# Patient Record
Sex: Female | Born: 1945 | Race: White | Hispanic: No | Smoking: Former smoker
Health system: Southern US, Community
[De-identification: ages and names within clinical notes are randomized; demographics above are authoritative.]

## PROBLEM LIST (undated history)

## (undated) DIAGNOSIS — N390 Urinary tract infection, site not specified: Secondary | ICD-10-CM

## (undated) DIAGNOSIS — K219 Gastro-esophageal reflux disease without esophagitis: Secondary | ICD-10-CM

## (undated) DIAGNOSIS — M1990 Unspecified osteoarthritis, unspecified site: Secondary | ICD-10-CM

## (undated) DIAGNOSIS — F3289 Other specified depressive episodes: Secondary | ICD-10-CM

## (undated) DIAGNOSIS — F329 Major depressive disorder, single episode, unspecified: Secondary | ICD-10-CM

## (undated) DIAGNOSIS — I341 Nonrheumatic mitral (valve) prolapse: Secondary | ICD-10-CM

## (undated) DIAGNOSIS — Z803 Family history of malignant neoplasm of breast: Secondary | ICD-10-CM

## (undated) DIAGNOSIS — D72819 Decreased white blood cell count, unspecified: Secondary | ICD-10-CM

## (undated) DIAGNOSIS — J309 Allergic rhinitis, unspecified: Secondary | ICD-10-CM

## (undated) DIAGNOSIS — G4700 Insomnia, unspecified: Secondary | ICD-10-CM

## (undated) DIAGNOSIS — F419 Anxiety disorder, unspecified: Secondary | ICD-10-CM

## (undated) DIAGNOSIS — R5383 Other fatigue: Secondary | ICD-10-CM

## (undated) DIAGNOSIS — Z780 Asymptomatic menopausal state: Secondary | ICD-10-CM

## (undated) DIAGNOSIS — Z800 Family history of malignant neoplasm of digestive organs: Secondary | ICD-10-CM

## (undated) HISTORY — DX: Urinary tract infection, site not specified: N39.0

## (undated) HISTORY — DX: Gastro-esophageal reflux disease without esophagitis: K21.9

## (undated) HISTORY — DX: Other fatigue: R53.83

## (undated) HISTORY — DX: Family history of malignant neoplasm of breast: Z80.3

## (undated) HISTORY — DX: Insomnia, unspecified: G47.00

## (undated) HISTORY — DX: Allergic rhinitis, unspecified: J30.9

## (undated) HISTORY — DX: Decreased white blood cell count, unspecified: D72.819

## (undated) HISTORY — DX: Anxiety disorder, unspecified: F41.9

## (undated) HISTORY — DX: Asymptomatic menopausal state: Z78.0

## (undated) HISTORY — DX: Other specified depressive episodes: F32.89

## (undated) HISTORY — DX: Family history of malignant neoplasm of digestive organs: Z80.0

## (undated) HISTORY — DX: Nonrheumatic mitral (valve) prolapse: I34.1

## (undated) HISTORY — DX: Major depressive disorder, single episode, unspecified: F32.9

## (undated) HISTORY — DX: Unspecified osteoarthritis, unspecified site: M19.90

---

## 1976-03-04 HISTORY — PX: LAPAROSCOPY: SHX197

## 1999-08-15 ENCOUNTER — Ambulatory Visit (HOSPITAL_COMMUNITY): Admission: RE | Admit: 1999-08-15 | Discharge: 1999-08-15 | Payer: Self-pay | Admitting: Gastroenterology

## 2000-04-04 DIAGNOSIS — M1990 Unspecified osteoarthritis, unspecified site: Secondary | ICD-10-CM

## 2000-04-04 HISTORY — DX: Unspecified osteoarthritis, unspecified site: M19.90

## 2000-05-13 ENCOUNTER — Other Ambulatory Visit: Admission: RE | Admit: 2000-05-13 | Discharge: 2000-05-13 | Payer: Self-pay | Admitting: Obstetrics and Gynecology

## 2000-05-14 ENCOUNTER — Encounter (INDEPENDENT_AMBULATORY_CARE_PROVIDER_SITE_OTHER): Payer: Self-pay

## 2000-05-14 ENCOUNTER — Other Ambulatory Visit: Admission: RE | Admit: 2000-05-14 | Discharge: 2000-05-14 | Payer: Self-pay | Admitting: Obstetrics and Gynecology

## 2001-09-30 ENCOUNTER — Other Ambulatory Visit: Admission: RE | Admit: 2001-09-30 | Discharge: 2001-09-30 | Payer: Self-pay | Admitting: Obstetrics and Gynecology

## 2002-12-07 ENCOUNTER — Encounter: Payer: Self-pay | Admitting: Allergy and Immunology

## 2002-12-07 ENCOUNTER — Encounter: Admission: RE | Admit: 2002-12-07 | Discharge: 2002-12-07 | Payer: Self-pay | Admitting: Allergy and Immunology

## 2003-04-29 ENCOUNTER — Other Ambulatory Visit: Admission: RE | Admit: 2003-04-29 | Discharge: 2003-04-29 | Payer: Self-pay | Admitting: Obstetrics and Gynecology

## 2004-04-05 ENCOUNTER — Encounter: Admission: RE | Admit: 2004-04-05 | Discharge: 2004-04-05 | Payer: Self-pay | Admitting: Internal Medicine

## 2004-07-02 DIAGNOSIS — D72819 Decreased white blood cell count, unspecified: Secondary | ICD-10-CM

## 2004-07-02 HISTORY — DX: Decreased white blood cell count, unspecified: D72.819

## 2004-11-30 ENCOUNTER — Other Ambulatory Visit: Admission: RE | Admit: 2004-11-30 | Discharge: 2004-11-30 | Payer: Self-pay | Admitting: Obstetrics and Gynecology

## 2005-01-15 ENCOUNTER — Ambulatory Visit (HOSPITAL_COMMUNITY): Admission: RE | Admit: 2005-01-15 | Discharge: 2005-01-15 | Payer: Self-pay | Admitting: Plastic Surgery

## 2005-09-24 LAB — HM COLONOSCOPY: HM Colonoscopy: NORMAL

## 2007-06-26 ENCOUNTER — Other Ambulatory Visit: Admission: RE | Admit: 2007-06-26 | Discharge: 2007-06-26 | Payer: Self-pay | Admitting: Obstetrics and Gynecology

## 2007-09-02 LAB — HM DEXA SCAN

## 2010-07-20 NOTE — Procedures (Signed)
[place]  Patient:    Stephanie Harrell, Stephanie Harrell                      MRN: [id number] Proc. Date: 08/15/99 Adm. Date:  [id number] Disc. Date: [id number] Attending:  Charna Elizabeth CC:         Heather Roberts, M.D.                           Procedure Report  DATE OF BIRTH:  1945-03-06  REFERRING PHYSICIAN:  Heather Roberts, M.D.  PROCEDURE PERFORMED:  Colonoscopy.  ENDOSCOPIST:  Anselmo Rod, M.D.  INSTRUMENT USED:  Olympus video colonoscope.  INDICATION FOR PROCEDURE:  Family history of colon cancer, blood in stool in a 65 year old white female.  Rule out colonic polyps, masses, hemorrhoids, etc.  PREPROCEDURE PREPARATION:  Informed consent was procured from the patient. The patient was fasted for eight hours prior to the procedure and prepped with a bottle of magnesium citrate and a gallon of NuLytely the night prior to the procedure.  PREPROCEDURE PHYSICAL:  The patient has stable vital signs.  NECK:  Supple.  CHEST: Clear to auscultation.  S1 and S2 regular.  No murmurs, rub, or gallop. No rales, rhonchi or wheezing.  ABDOMEN: Soft with normal abdominal bowel sounds.  No hepatosplenomegaly.  No masses palpable.  DESCRIPTION OF PROCEDURE:  The patient was placed in the left lateral decubitus position and sedated with 75 mg of Demerol and 7 mg of Versed intravenously.  Once the patient was adequately sedated and maintained on low flow oxygen, continuous cardiac monitoring, the Olympus video colonoscope was advanced from the rectum to the cecum without difficulty.  Except for small internal hemorrhoids no other abnormalities were seen.  The procedure was completed to the cecum. The appendicular orifice and the ileocecal valve were clearly visualized.  No masses, polyps, erosions, ulcerations or diverticulosis were seen.  IMPRESSION:  Normal colonoscopy except for small nonbleeding internal hemorrhoids.  RECOMMENDATIONS: 1. The  patient had been advised to increase her fluids and fiber in her diet. 2. Considering her family history of colon cancer repeat colonoscopy is    recommended in the next five years. 3. Outpatient follow up is advised in the next two weeks. DD:  08/15/99 TD:  08/20/99 Job: [id number]

## 2011-01-03 ENCOUNTER — Ambulatory Visit (INDEPENDENT_AMBULATORY_CARE_PROVIDER_SITE_OTHER): Payer: BC Managed Care – PPO | Admitting: Family Medicine

## 2011-01-03 ENCOUNTER — Encounter: Payer: Self-pay | Admitting: Family Medicine

## 2011-01-03 VITALS — BP 120/70 | HR 68 | Temp 98.6°F | Ht 68.0 in | Wt 140.0 lb

## 2011-01-03 DIAGNOSIS — K219 Gastro-esophageal reflux disease without esophagitis: Secondary | ICD-10-CM

## 2011-01-03 DIAGNOSIS — J329 Chronic sinusitis, unspecified: Secondary | ICD-10-CM

## 2011-01-03 DIAGNOSIS — G4700 Insomnia, unspecified: Secondary | ICD-10-CM | POA: Insufficient documentation

## 2011-01-03 DIAGNOSIS — J309 Allergic rhinitis, unspecified: Secondary | ICD-10-CM

## 2011-01-03 DIAGNOSIS — F3289 Other specified depressive episodes: Secondary | ICD-10-CM | POA: Insufficient documentation

## 2011-01-03 DIAGNOSIS — F329 Major depressive disorder, single episode, unspecified: Secondary | ICD-10-CM

## 2011-01-03 MED ORDER — AZITHROMYCIN 250 MG PO TABS: ORAL_TABLET | ORAL | Status: AC

## 2011-01-03 MED ORDER — TRAZODONE HCL 50 MG PO TABS: 25.0000 mg | ORAL_TABLET | Freq: Every day | ORAL | Status: DC

## 2011-01-03 MED ORDER — SERTRALINE HCL 100 MG PO TABS: 150.0000 mg | ORAL_TABLET | Freq: Every day | ORAL | Status: DC

## 2011-01-03 NOTE — Patient Instructions (Signed)
I recommend you take a once-daily antihistamine, such as Claritin, Allegra or Zyrtec.  You may also use a decongestant such as sudafed--take it if you are having sinus pain.  You may also want to try sinus rinses or Neti-Pot to help with the sinus pain  If your symptoms of sinus pain aren't improving over the next few days of using these medications, or if secretions become more discolored, then go ahead and start the antibiotics.  Make sure that if you start the antibiotics, that you complete the entire course.

## 2011-01-03 NOTE — Progress Notes (Signed)
Chief complaint: sinus problems. runny nose x 3 weeks. Tried Sudafed and Benadryl w/o any relief. Facial tenderness, some coughing  HPI: She was sick a few weeks ago with flu-like symptoms.  Lowgrade fever, body aches, head congestion and cough, and some diarrhea as well.  Most symptoms have resolved, just the sinus symptoms have remained, and worsened.  Nasal mucus is clear, constant runny nose, +postnasal drainage.  Phlegm is yellow-white.  +sneezing, no itchy eyes.  Patient is also requesting refills on Trazadone and Sertraline.  1/2 tablet of Trazadone seems to be effective in treating insomnia.  Sertraline is effective in treating her depression.  Job isn't ideal, looking for other work, has some stress, but overall is doing okay.  Past Medical History  Diagnosis Date  . Depressive disorder, not elsewhere classified   . Insomnia   . Mitral valve prolapse   . GERD (gastroesophageal reflux disease)   . Allergic rhinitis, cause unspecified     mold, mildew, and seasonal allergies  . Endometriosis     Past Surgical History  Procedure Date  . Laparoscopy     (prior to getting pregnant, some sort of treatment to ovary??)    History   Social History  . Marital Status: Married    Spouse Name: N/A    Number of Children: 2  . Years of Education: N/A   Occupational History  . 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 Smoking status: Current Some Day Smoker  . Smokeless tobacco: Never Used   Comment: smokes a cigarette every 10 days or so  . Alcohol Use: Not on file     2 glasses of wine most days of the week  . Drug Use: No  . Sexually Active: Not on file   Other Topics Concern  . Not on file   Social History Narrative  . No narrative on file    Family History  Problem Relation Age of Onset  . Hypertension Mother   . Cancer Mother     uterine cancer @ 64; Breast cancer at 43 and 65's  . Heart disease Father   . Cancer Father     pancreatic  .  Depression Father   . Depression Sister   . Depression Brother   . Cancer Paternal Aunt     breast  . Cancer Maternal Grandmother 83    colon cancer   Current outpatient prescriptions:lansoprazole (PREVACID) 15 MG capsule, Take 30 mg by mouth daily.  , Disp: , Rfl: ;  sertraline (ZOLOFT) 100 MG tablet, Take 1.5 tablets (150 mg total) by mouth daily., Disp: 45 tablet, Rfl: 5;  traZODone (DESYREL) 50 MG tablet, Take 0.5 tablets (25 mg total) by mouth at bedtime. You may take a full tablet at bedtime if needed, Disp: 30 tablet, Rfl: 5 DISCONTD: sertraline (ZOLOFT) 100 MG tablet, Take 150 mg by mouth daily.  , Disp: , Rfl: ;  azithromycin (ZITHROMAX Z-PAK) 250 MG tablet, Take 2 tablets (500 mg) on  Day 1,  followed by 1 tablet (250 mg) once daily on Days 2 through 5., Disp: 6 each, Rfl: 0  Allergies  Allergen Reactions  . Codeine Other (See Comments)    Feels faint.   ROS:  Denies recent fevers, chest pain, rare palpitations (related to MVP).  Denies shortness of breath, skin rash.  Diarrhea has resolved.  No nausea, vomiting, or any reflux as long as she takes her medication.  Moods stable, insomnia controlled.  No joint pains or other concerns  PHYSICAL EXAM: BP 120/70  Pulse 68  Temp(Src) 98.6 F (37 C) (Oral)  Ht 5\' 8"  (1.727 m)  Wt 140 lb (63.504 kg)  BMI 21.29 kg/m2 Well developed, pleasant female in no distress. Rare throat-clearing HEENT: PERRL. EOMI, conjunctiva clear.  TM's and EAC's normal.  OP clear, mild cobblestoning posteriorly.  Nasal mucosa mildly edematous, no erythema or purulence.  Mildly tender over bilateral maxillary sinuses. Neck: no lymphadenopathy, thyromegaly or mass Heart: regular rate and rhythm without murmur, rubs, gallops Lungs: clear bilaterally Abdomen: soft, nontender, no organomegaly or mass Extremities: no edema, 2+ pulses Skin: no rash Neuro: alert and oriented x 3; normal strength, sensation, gait Psych: normal mood, affect, hygiene and  grooming  ASSESSMENT/PLAN: 1. Allergic rhinitis, cause unspecified    2. Depressive disorder, not elsewhere classified  sertraline (ZOLOFT) 100 MG tablet  3. Insomnia  traZODone (DESYREL) 50 MG tablet  4. Sinusitis  azithromycin (ZITHROMAX Z-PAK) 250 MG tablet  5. GERD (gastroesophageal reflux disease)     Symptoms sound consistent with recent viral illness, now also contributed by allergies.  Can't rule out early sinus infection.  Recommend treatment with anti-histamines, decongestants, sinus rinses.  If sinus pain persists or worsens over the next few days, then start the z-pak, and take as directed.  Depression--well controlled with meds.  Job situation isn't ideal, and she doesn't seem happy overall.  Discussed that if depression doesn't seem to be as well controlled, consider adding Welbutrin Insomnia--controlled. Meds refilled.  GERD--controlled with OTC PPI.   Encouraged her to cut back on her alcohol to just 1 glass of wine daily.  Discussed relaxation techniques, encouraged exercise Encouraged her to quit smoking.  This is somewhat of a new habit to her, doesn't smoke daily.  Briefly discussed risks and encouraged complete cessation (person she is currently dating smokes)

## 2011-03-28 ENCOUNTER — Encounter: Payer: Self-pay | Admitting: *Deleted

## 2011-06-17 ENCOUNTER — Ambulatory Visit (INDEPENDENT_AMBULATORY_CARE_PROVIDER_SITE_OTHER): Payer: BC Managed Care – PPO | Admitting: Family Medicine

## 2011-06-17 ENCOUNTER — Encounter: Payer: Self-pay | Admitting: Family Medicine

## 2011-06-17 VITALS — BP 152/80 | HR 84 | Temp 98.7°F | Ht 68.0 in | Wt 146.0 lb

## 2011-06-17 DIAGNOSIS — R197 Diarrhea, unspecified: Secondary | ICD-10-CM

## 2011-06-17 DIAGNOSIS — J309 Allergic rhinitis, unspecified: Secondary | ICD-10-CM

## 2011-06-17 MED ORDER — FLUTICASONE PROPIONATE 50 MCG/ACT NA SUSP: 2.0000 | Freq: Every day | NASAL | Status: DC

## 2011-06-17 NOTE — Progress Notes (Signed)
Chief complaint: patient states that she has allergies, has had diarrhea x 5 days and her chest feels "wheezy." States that she has really just felt this bad for about a month  HPI:  Started feeling bad about a month ago, with allergies starting.  Head congestion, sinus pressure around her eyes, runny nose.  Has used decongestants, antihistamines--tried Claritin.  Also tried Mucinex.  They seemed to help, but was just temporary.   Thinks there may be something at work that is bothering her allergies, as symptoms are worse at work, especially in the last 2 weeks.  Mucus from nose is clear. Recalls using nasal steroid sprays in the past with good success.    Diarrhea x 5-7 days.  Started out feeling "flu-like". Similar illnesses at work.  Denies nausea or vomiting, but some decreased appetite.  Imodium seemed to help.  Stools now are soft, had been very watery and frequent.  No blood in stool.  Had some ice cream last night.  Past Medical History  Diagnosis Date  . Depressive disorder, not elsewhere classified   . Insomnia   . Mitral valve prolapse   . GERD (gastroesophageal reflux disease)   . Allergic rhinitis, cause unspecified     mold, mildew, and seasonal allergies  . Endometriosis   . Postmenopausal   . Fatigue   . 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 Anxiety   . WBC decreased 5/06    borderline (3.5-4.0)  . Frequent UTI     h/o  . MVP (mitral valve prolapse)     h/o  . FHx: colon cancer   . FHx: breast cancer     Past Surgical History  Procedure Date  . Laparoscopy     (prior to getting pregnant, some sort of treatment to ovary??)    History   Social History  . Marital Status: Married    Spouse Name: N/A    Number of Children: 2  . Years of Education: N/A   Occupational History  . 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 Smoking status: Former Smoker    Quit date: 03/05/2011  . Smokeless tobacco:  Never Used   Comment: smokes a cigarette every 10 days or so  . Alcohol Use: Yes     2 glasses of wine most days of the week  . Drug Use: No  . Sexually Active: Not on file   Other Topics Concern  . Not on file   Social History Narrative  . No narrative on file    Family History  Problem Relation Age of Onset  . Hypertension Mother   . Cancer Mother     uterine cancer @ 25; Breast cancer at 70 and 58's  . Heart disease Father   . Cancer Father     pancreatic  . Depression Father   . Depression Sister   . Depression Brother   . Cancer Paternal Aunt     breast  . Cancer Maternal Grandmother 10    colon cancer   Current Outpatient Prescriptions on File Prior to Visit  Medication Sig Dispense Refill  . lansoprazole (PREVACID) 15 MG capsule Take 30 mg by mouth daily.        . sertraline (ZOLOFT) 100 MG tablet Take 1.5 tablets (150 mg total) by mouth daily.  45 tablet  5  . traZODone (DESYREL) 50 MG tablet Take 0.5 tablets (25 mg  total) by mouth at bedtime. You may take a full tablet at bedtime if needed  30 tablet  5  . fluticasone (FLONASE) 50 MCG/ACT nasal spray Place 2 sprays into the nose daily.  16 g  6    Allergies  Allergen Reactions  . Codeine Other (See Comments)    Feels faint.   ROS:  Denies fevers, chest pain, shortness of breath, productive cough, skin rash.  Denies nausea, vomiting. +diarrhea per HPI  PHYSICAL EXAM: BP 152/80  Pulse 84  Temp(Src) 98.7 F (37.1 C) (Oral)  Ht 5\' 8"  (1.727 m)  Wt 146 lb (66.225 kg)  BMI 22.20 kg/m2 Well developed, pleasant female, mildly congested, in no distress HEENT:  PERRL, EOMI, conjunctiva clear.  TM's and EAC's normal.  OP normal.  Nasal mucosa moderately edematous, pale, no purulence.  Sinuses nontender (reportedly painful in all 4, nontender on exam) Neck: no lymphadenopathy Heart: regular rate and rhythm without murmur Lungs: clear bilaterally Skin: no rash Abdomen: soft, nontender, active bowel  sounds.  ASSESSMENT/PLAN: 1. Allergic rhinitis, cause unspecified  fluticasone (FLONASE) 50 MCG/ACT nasal spray  2. Diarrhea     most likely due to viral illness   Allergies--start flonase.  Proper technique reviewed. Continue OTC antihistamine (ie Claritin, or zyrtec or allegra), and mucinex as needed, decongestants as needed for sinus pain (use cautiously and monitor blood pressure--slightly elevated today). Continue with sinus rinses.  Diarrhea--likely was a viral illness. Avoid dairy for another few days.  Probiotics may help things get back on track.

## 2011-06-17 NOTE — Patient Instructions (Signed)
Allergies--start flonase.  Continue OTC antihistamine (ie Claritin, or zyrtec or allegra), and mucinex as needed, decongestants as needed for sinus pain (use cautiously and monitor blood pressure--slightly elevated today). Continue with sinus rinses.  Diarrhea--likely was a viral illness. You may still have some lactose intolerance so avoid dairy for another few days.  Probiotics may help things get back on track.

## 2011-06-19 ENCOUNTER — Ambulatory Visit: Payer: BC Managed Care – PPO | Admitting: Family Medicine

## 2011-07-07 ENCOUNTER — Other Ambulatory Visit: Payer: Self-pay | Admitting: Family Medicine

## 2011-07-07 DIAGNOSIS — G4700 Insomnia, unspecified: Secondary | ICD-10-CM

## 2011-07-08 NOTE — Telephone Encounter (Signed)
Is this ok to refill?  

## 2011-07-08 NOTE — Telephone Encounter (Signed)
done

## 2011-07-16 ENCOUNTER — Other Ambulatory Visit: Payer: Self-pay | Admitting: Family Medicine

## 2011-07-16 DIAGNOSIS — F329 Major depressive disorder, single episode, unspecified: Secondary | ICD-10-CM

## 2011-07-16 NOTE — Telephone Encounter (Signed)
RX REFILL FOR ZOLOFT. 

## 2011-07-16 NOTE — Telephone Encounter (Signed)
done

## 2012-01-11 ENCOUNTER — Other Ambulatory Visit: Payer: Self-pay | Admitting: Family Medicine

## 2012-01-13 NOTE — Telephone Encounter (Signed)
done

## 2012-01-13 NOTE — Telephone Encounter (Signed)
Is this okay to refill? 

## 2012-04-21 ENCOUNTER — Ambulatory Visit (INDEPENDENT_AMBULATORY_CARE_PROVIDER_SITE_OTHER): Payer: BC Managed Care – PPO | Admitting: Family Medicine

## 2012-04-21 ENCOUNTER — Encounter: Payer: Self-pay | Admitting: Family Medicine

## 2012-04-21 VITALS — BP 118/72 | HR 68 | Temp 98.2°F | Ht 68.0 in | Wt 147.0 lb

## 2012-04-21 DIAGNOSIS — F329 Major depressive disorder, single episode, unspecified: Secondary | ICD-10-CM

## 2012-04-21 DIAGNOSIS — J019 Acute sinusitis, unspecified: Secondary | ICD-10-CM

## 2012-04-21 DIAGNOSIS — G4700 Insomnia, unspecified: Secondary | ICD-10-CM

## 2012-04-21 DIAGNOSIS — L259 Unspecified contact dermatitis, unspecified cause: Secondary | ICD-10-CM

## 2012-04-21 DIAGNOSIS — L309 Dermatitis, unspecified: Secondary | ICD-10-CM

## 2012-04-21 MED ORDER — AMOXICILLIN 500 MG PO CAPS: 1000.0000 mg | ORAL_CAPSULE | Freq: Two times a day (BID) | ORAL | Status: DC

## 2012-04-21 MED ORDER — TRIAMCINOLONE ACETONIDE 0.1 % EX CREA: TOPICAL_CREAM | Freq: Two times a day (BID) | CUTANEOUS | Status: DC

## 2012-04-21 NOTE — Progress Notes (Signed)
Chief Complaint  Patient presents with  . Cough    head congestion and HA x 10 days. Cough x 3 days. Has had some intermittent dizziness, also some stomach upset. No fever or chills.   HPI:  Started 10 days ago with head congestion.  She was worse last week, slightly better earlier in the week, but today feels worse.  Nasal mucus is light yellow, not too thick.  She has a chronic runny nose (? Allergic to fabrics at work). +PND.  Some hoarseness--unsure if related to reflux (has been told in past, and occurs when she eats certain foods).  Cough x 3 days, nonproductive.  Denies shortness of breath or wheezing.  Denies fevers.  Some dizziness/vertigo with quick head turn.  +sick contacts. Worsening sinus headache today.  No numbness, tingling, weakness or vomiting.  Using a short-acting antihistamine (once or twice daily)--chronically, due to runny nose at work.  (doesn't really use claritin regularly). Has also added a decongestant in the last week.  Helps a little, but still having headache.  Hasn't tried sinus rinses with this illness (has used in past).   She is also complaining of a dry patch on lower back x months.  Slight increase in size.  At some point was itchy, she scratched at it.  Not very itchy now.  No other skin lesions.  No pets. No exposures to ringworm.  Depression and insomnia--doing very well on current regimen of zoloft and trazadone.  Sleeps well with just 1/2 tablet nightly. She has a few months of refills left, doesn't need refill today (but doesn't want to have to return when needed.  Past Medical History  Diagnosis Date  . Depressive disorder, not elsewhere classified   . Insomnia   . Mitral valve prolapse   . GERD (gastroesophageal reflux disease)   . Allergic rhinitis, cause unspecified     mold, mildew, and seasonal allergies  . Endometriosis   . Postmenopausal   . Fatigue   . 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 Anxiety    . WBC decreased 5/06    borderline (3.5-4.0)  . Frequent UTI     h/o  . MVP (mitral valve prolapse)     h/o  . FHx: colon cancer   . FHx: breast cancer    Past Surgical History  Procedure Laterality Date  . Laparoscopy      (prior to getting pregnant, some sort of treatment to ovary??)   History   Social History  . Marital Status: Married    Spouse Name: N/A    Number of Children: 2  . Years of Education: N/A   Occupational History  . 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 Smoking status: Former Smoker    Quit date: 03/05/2011  . Smokeless tobacco: Never Used     Comment: smokes a cigarette every 10 days or so  . Alcohol Use: Yes     Comment: 2 glasses of wine most days of the week  . Drug Use: No  . Sexually Active: Not on file   Other Topics Concern  . Not on file   Social History Narrative  . No narrative on file   Current Outpatient Prescriptions on File Prior to Visit  Medication Sig Dispense Refill  . fluticasone (FLONASE) 50 MCG/ACT nasal spray Place 2 sprays into the nose daily.  16 g  6  . lansoprazole (  PREVACID) 15 MG capsule Take 30 mg by mouth daily.        . sertraline (ZOLOFT) 100 MG tablet TAKE 1 & 1/2 (150MG ) BY MOUTH EVERY DAY  45 tablet  5  . traZODone (DESYREL) 50 MG tablet TAKE 1/2 TABLET BY MOUTH AT BEDTIME. YOU MAY TAKE A FULL TABLET AT BEDTIME IF NEEDED.  30 tablet  5  . nitrofurantoin (MACRODANTIN) 100 MG capsule Take 100 mg by mouth as needed.       No current facility-administered medications on file prior to visit.    Allergies  Allergen Reactions  . Codeine Other (See Comments)    Feels faint.   ROS:  Mild nausea, no vomiting or diarrhea. Denies skin rashes.  Some myalgias (back).  Denies fevers, chest pain, shortness of breath.  Denies other skin rashes, bleeding, bruising.  See HPI.  PHYSICAL EXAM:  Blood pressure 118/72, pulse 68, temperature 98.2 F (36.8 C), temperature source Oral, height 5\' 8"   (1.727 m), weight 147 lb (66.679 kg). Well developed, pleasant, well-appearing female in no distress HEENT: PERRL, EOMI, conjunctiva clear. TM's and EAC's normal.  OP clear. Nasal mucosa moderately edematous, mild erythema, no purulence.  Tender R>L maxillary sinuses.  Neck: small shotty lymphadenopathy Heart: regular rate and rhythm without murmur Lungs: clear bilaterally Abdomen: soft, nontender Extremities: no edema Skin: 20 x 13 mm dry patch at right lower back.  Raised edges.  Central area is not completely clear, but less raised. Psych: normal mood, affect, hygiene and grooming  ASSESSMENT/PLAN:  Acute sinusitis - continue decongestant, add guaifenesin and sinus rinses.  treat with amoxacillin.  call/return if symptoms persist or worsen - Plan: amoxicillin (AMOXIL) 500 MG capsule  Eczema - TAC 0.1% sparingly to affected area BID for up to 2 weeks.  keep well moisturized.  return for re-evalution if not resolving. 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  okay to refill when needed (pharmacy to call)  Insomnia - well controlled with Trazadone.  okay to refill when needed (pharmacy to call)  Allergies/chronic rhinitis: Consider changing to once daily anti-histamine for allergies (ie zyrtec, allegra or claritin, or their generic equivalent)

## 2012-04-21 NOTE — Patient Instructions (Addendum)
Use the cream sparingly to affected area, twice daily for up to 2 weeks.  Return if not resolved for re-evaluation, possible biopsy.  Start the amoxacillin today. Restart sinus rinses. Continue decongestants.  Add guaifenesin (expectorant, found in mucinex and robitussin) Consider changing to once daily anti-histamine for allergies (ie zyrtec, allegra or claritin, or their generic equivalent) for chronic runny nose/allergies

## 2012-07-07 ENCOUNTER — Other Ambulatory Visit: Payer: Self-pay | Admitting: Family Medicine

## 2012-07-07 DIAGNOSIS — F329 Major depressive disorder, single episode, unspecified: Secondary | ICD-10-CM

## 2012-07-07 NOTE — Telephone Encounter (Signed)
done

## 2012-07-07 NOTE — Telephone Encounter (Signed)
Is this ok?

## 2012-08-09 ENCOUNTER — Other Ambulatory Visit: Payer: Self-pay | Admitting: Family Medicine

## 2012-08-09 DIAGNOSIS — G4700 Insomnia, unspecified: Secondary | ICD-10-CM

## 2012-08-10 NOTE — Telephone Encounter (Signed)
Is this okay to refill? 

## 2012-08-10 NOTE — Telephone Encounter (Signed)
done

## 2012-08-12 ENCOUNTER — Ambulatory Visit: Payer: Self-pay | Admitting: Family Medicine

## 2012-08-20 ENCOUNTER — Other Ambulatory Visit: Payer: Self-pay | Admitting: Family Medicine

## 2012-09-22 ENCOUNTER — Encounter: Payer: Self-pay | Admitting: *Deleted

## 2012-09-28 ENCOUNTER — Encounter: Payer: Self-pay | Admitting: Nurse Practitioner

## 2012-09-28 ENCOUNTER — Ambulatory Visit (INDEPENDENT_AMBULATORY_CARE_PROVIDER_SITE_OTHER): Payer: BC Managed Care – PPO | Admitting: Nurse Practitioner

## 2012-09-28 VITALS — BP 122/50 | HR 60 | Resp 12 | Ht 68.0 in | Wt 142.0 lb

## 2012-09-28 DIAGNOSIS — Z0000 Encounter for general adult medical examination without abnormal findings: Secondary | ICD-10-CM

## 2012-09-28 DIAGNOSIS — Z01419 Encounter for gynecological examination (general) (routine) without abnormal findings: Secondary | ICD-10-CM

## 2012-09-28 LAB — POCT URINALYSIS DIPSTICK
Leukocytes, UA: NEGATIVE
pH, UA: 5.5

## 2012-09-28 MED ORDER — NITROFURANTOIN MONOHYD MACRO 100 MG PO CAPS: 100.0000 mg | ORAL_CAPSULE | Freq: Every day | ORAL | Status: DC

## 2012-09-28 NOTE — Progress Notes (Signed)
67 y.o. G3P2 Divorced Caucasian Fe here for annual exam.  Not dating or sexually for 2 years. daughters live in [place].  No LMP recorded. Patient is postmenopausal.          Sexually active: no  The current method of family planning is post menopausal status.    Exercising: yes  walk and pilates Smoker:  no  Health Maintenance: Pap:  09/23/2011  Negative  (prior Ascus with - HR HPV 2010) MMG:  10/08/2011 normal Colonoscopy:  09/2005 normal repeat in 5 years BMD:   09/2007  T Score: spine 2.6/ left femur neck -0.1 / left femur total 0.5 / left radius -0.8 TDaP:  2006 Labs: Hgb- 14.0   reports that she quit smoking about 18 months ago. She has never used smokeless tobacco. She reports that she drinks about 5.0 ounces of alcohol per week. She reports that she does not use illicit drugs.  Past Medical History  Diagnosis Date  . Depressive disorder, not elsewhere classified   . Insomnia   . Mitral valve prolapse   . GERD (gastroesophageal reflux disease)   . Allergic rhinitis, cause unspecified     mold, mildew, and seasonal allergies  . Endometriosis   . Postmenopausal   . Fatigue   . 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 Anxiety   . WBC decreased 5/06    borderline (3.5-4.0)  . Frequent UTI     h/o  . MVP (mitral valve prolapse)     h/o  . FHx: colon cancer   . FHx: breast cancer     Past Surgical History  Procedure Laterality Date  . Laparoscopy  1978    (prior to getting pregnant, for endometriosis with mini lap with wedge resection of ovary    Current Outpatient Prescriptions  Medication Sig Dispense Refill  . calcium carbonate 200 MG capsule Take 250 mg by mouth 2 (two) times daily with a meal.      . cholecalciferol (VITAMIN D) 1000 UNITS tablet Take 1,000 Units by mouth daily.      . fish oil-omega-3 fatty acids 1000 MG capsule Take 2 g by mouth daily.      . fluticasone (FLONASE) 50 MCG/ACT nasal spray USE 2 SPRAYS INTO EACH  NOSTRIL EVERY DAY  16 g  5  . lansoprazole (PREVACID) 15 MG capsule Take 30 mg by mouth daily.        Marland Kitchen loratadine (CLARITIN) 10 MG tablet Take 10 mg by mouth daily.      . Multiple Vitamin (MULTIVITAMIN) tablet Take 1 tablet by mouth daily.      . nitrofurantoin (MACRODANTIN) 100 MG capsule Take 100 mg by mouth as needed.      . nitrofurantoin, macrocrystal-monohydrate, (MACROBID) 100 MG capsule Take 1 capsule (100 mg total) by mouth daily.  30 capsule  2  . pseudoephedrine (SUDAFED) 30 MG tablet Take 30 mg by mouth every 4 (four) hours as needed for congestion.      . sertraline (ZOLOFT) 100 MG tablet TAKE 1 & 1/2 TABLET BY MOUTH EVERY DAY  45 tablet  5  . traZODone (DESYREL) 50 MG tablet TAKE 1/2 TABLET BY MOUTH AT BEDTIME. YOU MAY TAKE A FULL TABLET AT BEDTIME IF NEEDED.  30 tablet  7   No current facility-administered medications for this visit.    Family History  Problem Relation Age of Onset  . Hypertension Mother   . Cancer Mother  uterine cancer @ 90; Breast cancer at 44 and 50's  . Heart disease Father   . Cancer Father     pancreatic  . Depression Father   . Depression Sister   . Depression Brother   . Heart failure Brother   . Cancer Paternal Aunt 40    breast  . Cancer Maternal Grandmother 69    colon cancer    ROS:  Pertinent items are noted in HPI.  Otherwise, a comprehensive ROS was negative.  Exam:   BP 122/50  Pulse 60  Resp 12  Ht 5\' 8"  (1.727 m)  Wt 142 lb (64.411 kg)  BMI 21.6 kg/m2 Height: 5\' 8"  (172.7 cm)  Ht Readings from Last 3 Encounters:  09/28/12 5\' 8"  (1.727 m)  04/21/12 5\' 8"  (1.727 m)  06/17/11 5\' 8"  (1.727 m)    General appearance: alert, cooperative and appears stated age Head: Normocephalic, without obvious abnormality, atraumatic Neck: no adenopathy, supple, symmetrical, trachea midline and thyroid normal to inspection and palpation Lungs: clear to auscultation bilaterally Breasts: normal appearance, no masses or  tenderness Heart: regular rate and rhythm Abdomen: soft, non-tender; no masses,  no organomegaly Extremities: extremities normal, atraumatic, no cyanosis or edema Skin: Skin color, texture, turgor normal. No rashes or lesions Lymph nodes: Cervical, supraclavicular, and axillary nodes normal. No abnormal inguinal nodes palpated Neurologic: Grossly normal   Pelvic: External genitalia:  no lesions              Urethra:  normal appearing urethra with no masses, tenderness or lesions              Bartholin's and Skene's: normal                 Vagina: normal appearing vagina with normal color and discharge, no lesions              Cervix: anteverted              Pap taken: yes Bimanual Exam:  Uterus:  normal size, contour, position, consistency, mobility, non-tender              Adnexa: no mass, fullness, tenderness               Rectovaginal: Confirms               Anus:  normal sphincter tone, no lesions  A:  Well Woman with normal exam  postmenopausal no HRT  FMH + colon and breast cancer  history of ASCUS with negative HR HPV 2010  P:   Pap smear as per guidelines   Mammogram due 10/2012  counseled on breast self exam, adequate intake of calcium and vitamin D, diet and exercise, Kegel's exercises return annually or prn  An After Visit Summary was printed and given to the patient.

## 2012-09-28 NOTE — Patient Instructions (Addendum)

## 2012-09-30 NOTE — Progress Notes (Signed)
Encounter reviewed by Dr. Brook Silva.  

## 2013-01-06 ENCOUNTER — Other Ambulatory Visit: Payer: Self-pay | Admitting: Family Medicine

## 2013-01-06 DIAGNOSIS — F329 Major depressive disorder, single episode, unspecified: Secondary | ICD-10-CM

## 2013-01-06 NOTE — Telephone Encounter (Signed)
done

## 2013-01-06 NOTE — Telephone Encounter (Signed)
Is this okay to fill? 

## 2013-01-08 ENCOUNTER — Other Ambulatory Visit: Payer: Self-pay | Admitting: Family Medicine

## 2013-01-11 ENCOUNTER — Other Ambulatory Visit: Payer: Self-pay | Admitting: *Deleted

## 2013-01-11 ENCOUNTER — Other Ambulatory Visit: Payer: Self-pay | Admitting: Family Medicine

## 2013-01-11 NOTE — Telephone Encounter (Signed)
This should have already been done last week (11/5 per computer)

## 2013-01-11 NOTE — Telephone Encounter (Signed)
Is this okay to refill? Hasn't been seen in a while, no future appt scheduled.

## 2013-02-05 ENCOUNTER — Encounter: Payer: Self-pay | Admitting: Family Medicine

## 2013-02-05 ENCOUNTER — Ambulatory Visit (INDEPENDENT_AMBULATORY_CARE_PROVIDER_SITE_OTHER): Payer: BC Managed Care – PPO | Admitting: Family Medicine

## 2013-02-05 VITALS — BP 112/70 | HR 74 | Temp 98.7°F | Wt 137.0 lb

## 2013-02-05 DIAGNOSIS — R509 Fever, unspecified: Secondary | ICD-10-CM

## 2013-02-05 DIAGNOSIS — K319 Disease of stomach and duodenum, unspecified: Secondary | ICD-10-CM

## 2013-02-05 DIAGNOSIS — J069 Acute upper respiratory infection, unspecified: Secondary | ICD-10-CM

## 2013-02-05 DIAGNOSIS — K3189 Other diseases of stomach and duodenum: Secondary | ICD-10-CM

## 2013-02-05 MED ORDER — HYOSCYAMINE SULFATE ER 0.375 MG PO TB12: 0.3750 mg | ORAL_TABLET | Freq: Two times a day (BID) | ORAL | Status: DC

## 2013-02-05 NOTE — Progress Notes (Signed)
   Subjective:    Patient ID: Stephanie Harrell, female    DOB: 1946/02/29, 67 y.o.   MRN: [id number]  HPI She has a ten-day history this started with myalgias malaise, chest congestion and coughing. The condition continued and she also became much more fatigued. The cough and congestion continued and she also developed some abdominal pain that was worse after eating. She describes the pain is constant and dull. She does have reflux disease and takes Prevacid on a daily basis.   Review of Systems     Objective:   Physical Exam alert and in no distress. Tympanic membranes and canals are normal. Throat is clear. Tonsils are normal. Neck is supple without adenopathy or thyromegaly. Cardiac exam shows a regular sinus rhythm without murmurs or gallops. Lungs are clear to auscultation. Abdominal exam shows decreased bowel sounds with slight mid and right upper quadrant tenderness but negative Murphy's sign no Murphy's punch Flu test negative      Assessment & Plan:  Fever - Plan: Influenza A/B  Spasm of GI tract - Plan: hyoscyamine (LEVBID) 0.375 MG 12 hr tablet  Viral URI with cough  Discussed the fact that she has a URI and treat that symptomatically. I will give her Levbid to see if this will help with her abdominal pain. She will call if she has difficulties.

## 2013-03-15 ENCOUNTER — Ambulatory Visit (INDEPENDENT_AMBULATORY_CARE_PROVIDER_SITE_OTHER): Payer: BC Managed Care – PPO | Admitting: Family Medicine

## 2013-03-15 ENCOUNTER — Ambulatory Visit: Payer: BC Managed Care – PPO | Admitting: Family Medicine

## 2013-03-15 ENCOUNTER — Encounter: Payer: Self-pay | Admitting: Family Medicine

## 2013-03-15 VITALS — BP 122/86 | HR 80 | Temp 98.1°F | Ht 68.5 in | Wt 140.0 lb

## 2013-03-15 DIAGNOSIS — J329 Chronic sinusitis, unspecified: Secondary | ICD-10-CM

## 2013-03-15 DIAGNOSIS — J40 Bronchitis, not specified as acute or chronic: Secondary | ICD-10-CM

## 2013-03-15 MED ORDER — AZITHROMYCIN 250 MG PO TABS: ORAL_TABLET | ORAL | Status: DC

## 2013-03-15 NOTE — Progress Notes (Signed)
Chief Complaint  Patient presents with  . Cough    for over a month, saw Dr.Lalonde and 02/05/13 has had since then but has worsened ober the last 10 days. (mother passed away this week) Has had only slight nausea-no other symptoms.Mucus is green in color.    She was seen 12/5 after 10 days of flu-like symptoms.  Symptoms improved, but never completely got better.  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  Symptoms got worse in the last 10 days--green mucus.  No fevers, no shortness of breath.  She has some sinus pain, relieved by decongestants.  Denies ear pain; slight sore throat (?from GERD vs drainage).    She tried Mucinex DM, but it made her nauseated.  She has also been using OTC Robitussin congestion and cough, which helps some.    Mom recently passed--saw her twice a week (here in [place])--related to worsening of heart issues. She has been working, plus busy cleaning out her mother's house.  Considering moving to [facility].  Past Medical History  Diagnosis Date  . Depressive disorder, not elsewhere classified   . Insomnia   . Mitral valve prolapse   . GERD (gastroesophageal reflux disease)   . Allergic rhinitis, cause unspecified     mold, mildew, and seasonal allergies  . Endometriosis   . Postmenopausal   . Fatigue   . 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 Anxiety   . WBC decreased 5/06    borderline (3.5-4.0)  . Frequent UTI     h/o  . MVP (mitral valve prolapse)     h/o  . FHx: colon cancer   . FHx: breast cancer    Past Surgical History  Procedure Laterality Date  . Laparoscopy  1978    (prior to getting pregnant, for endometriosis with mini lap with wedge resection of ovary   History   Social History  . Marital Status: Married    Spouse Name: N/A    Number of Children: 2  . Years of Education: N/A   Occupational History  . Public affairs consultant at [employer] Topics  . Smoking status: Former Smoker    Quit date: 03/05/2011  . Smokeless tobacco: Never Used     Comment: smokes a cigarette every 10 days or so  . Alcohol Use: 5.0 oz/week    10 drink(s) per week     Comment: 2 glasses of wine most days of the week  . Drug Use: No  . Sexual Activity: No   Other Topics Concern  . Not on file   Social History Narrative   Divorced.  Amicable relationship with her ex-husband. Lives by herself. Works at [employer].  Artist (paints, draws)   Current Outpatient Prescriptions on File Prior to Visit  Medication Sig Dispense Refill  . calcium carbonate 200 MG capsule Take 250 mg by mouth 2 (two) times daily with a meal.      . cholecalciferol (VITAMIN D) 1000 UNITS tablet Take 1,000 Units by mouth daily.      . fish oil-omega-3 fatty acids 1000 MG capsule Take 2 g by mouth daily.      . fluticasone (FLONASE) 50 MCG/ACT nasal spray USE 2 SPRAYS INTO EACH NOSTRIL EVERY DAY  16 g  5  . lansoprazole (PREVACID) 15 MG capsule Take 30 mg  by mouth daily.        Marland Kitchen loratadine (CLARITIN) 10 MG tablet Take 10 mg by mouth daily.      . Multiple Vitamin (MULTIVITAMIN) tablet Take 1 tablet by mouth daily.      . nitrofurantoin (MACRODANTIN) 100 MG capsule Take 100 mg by mouth as needed.      . pseudoephedrine (SUDAFED) 30 MG tablet Take 30 mg by mouth every 4 (four) hours as needed for congestion.      . sertraline (ZOLOFT) 100 MG tablet TAKE 1 & 1/2 TABLET BY MOUTH EVERY DAY  45 tablet  3  . traZODone (DESYREL) 50 MG tablet TAKE 1/2 TABLET BY MOUTH AT BEDTIME. YOU MAY TAKE A FULL TABLET AT BEDTIME IF NEEDED.  30 tablet  7   No current facility-administered medications on file prior to visit.   Allergies  Allergen Reactions  . Codeine Other (See Comments)    Feels faint.   ROS:  Denies fevers, dizziness, shortness of breath, chest pain, bleeding, bruising, rashes.  Denies urinary complaints (uses ABX prn after intercourse).   Denies vomiting, diarrhea, or other concerns. Moods have been okay.  PHYSICAL EXAM: BP 122/86  Pulse 80  Temp(Src) 98.1 F (36.7 C) (Oral)  Ht 5' 8.5" (1.74 m)  Wt 140 lb (63.504 kg)  BMI 20.98 kg/m2 Well developed, pleasant female in no distress.  Some sneezing, congestion, and coughing in exam room.  Speaking easily in full sentences HEENT:  PERRL, EOMI, TM's and EAC's normal, conjunctiva clear.  Nasal mucosa moderately edematous, mild erythema.  Sinuses nontender.  OP is clear Neck: no lymphadenopathy, thyromegaly or mass Heart: regular rate and rhythm, no murmur Lungs: clear bilaterally Abdomen: soft, nontender, no mass Skin: no rash Psych: normal mood, affect, hygiene and grooming Neuro: alert and oriented.  Normal cranial nerves, strength, gait  ASSESSMENT/PLAN:  Sinobronchitis - Plan: azithromycin (ZITHROMAX) 250 MG tablet  Supportive measures reviewed.  Continue decongestants, expectorant, cough suppressant prn. To refill on day#11 if any symptoms persist; call sooner if symptoms not improving/worsening.

## 2013-03-15 NOTE — Patient Instructions (Signed)
Drink plenty of fluids Continue expectorant (guaifenesin), decongestant, and cough suppressant as needed. Continue tylenol and/or ibuprofen as needed for fever or pain.  Start the z-pak today, taking 2 tablets (then 1 tablet daily). On day #10, if you aren't completely better, then get refill from pharmacy to start on day #11

## 2013-04-06 ENCOUNTER — Other Ambulatory Visit: Payer: Self-pay | Admitting: Family Medicine

## 2013-05-08 ENCOUNTER — Telehealth: Payer: Self-pay | Admitting: Family Medicine

## 2013-05-10 NOTE — Telephone Encounter (Signed)
Okay to refill x 1 (not discussed at recent acute visit).  Needs med check set up, vs CPE.  She is 67--when will she be going on Medicare for IPPE?

## 2013-05-10 NOTE — Telephone Encounter (Signed)
Is this okay to refill? 

## 2013-05-10 NOTE — Telephone Encounter (Signed)
Went ahead and sent refill for zoloft. Tried to leave message for pt to call and schedule med check vs CPE voicemail was full. I will try back again another day.

## 2013-05-20 ENCOUNTER — Encounter: Payer: Self-pay | Admitting: Family Medicine

## 2013-05-28 NOTE — Telephone Encounter (Signed)
Noted.  When I checked the schedule earlier, it looked like she had been added as a second physical in the afternoon on Monday.  She was scheduled last week, but canceled last minute (that morning) due to being locked out of her apartment (or something like that?).  Cancellation list is appropriate

## 2013-05-28 NOTE — Telephone Encounter (Signed)
Pt called and made a IPPE appt for September 01, 2013, which was next available. Pt was placed on cancellation list.

## 2013-05-31 ENCOUNTER — Encounter: Payer: BC Managed Care – PPO | Admitting: Family Medicine

## 2013-06-10 ENCOUNTER — Ambulatory Visit (INDEPENDENT_AMBULATORY_CARE_PROVIDER_SITE_OTHER): Payer: BC Managed Care – PPO | Admitting: Family Medicine

## 2013-06-10 ENCOUNTER — Encounter: Payer: Self-pay | Admitting: Family Medicine

## 2013-06-10 VITALS — BP 120/72 | HR 68 | Ht 68.0 in | Wt 136.0 lb

## 2013-06-10 DIAGNOSIS — G4700 Insomnia, unspecified: Secondary | ICD-10-CM

## 2013-06-10 DIAGNOSIS — Z0000 Encounter for general adult medical examination without abnormal findings: Secondary | ICD-10-CM

## 2013-06-10 DIAGNOSIS — Z79899 Other long term (current) drug therapy: Secondary | ICD-10-CM

## 2013-06-10 DIAGNOSIS — R5381 Other malaise: Secondary | ICD-10-CM

## 2013-06-10 DIAGNOSIS — F3289 Other specified depressive episodes: Secondary | ICD-10-CM

## 2013-06-10 DIAGNOSIS — J309 Allergic rhinitis, unspecified: Secondary | ICD-10-CM

## 2013-06-10 DIAGNOSIS — R5383 Other fatigue: Secondary | ICD-10-CM

## 2013-06-10 DIAGNOSIS — Z13220 Encounter for screening for lipoid disorders: Secondary | ICD-10-CM

## 2013-06-10 DIAGNOSIS — K219 Gastro-esophageal reflux disease without esophagitis: Secondary | ICD-10-CM

## 2013-06-10 DIAGNOSIS — F329 Major depressive disorder, single episode, unspecified: Secondary | ICD-10-CM

## 2013-06-10 DIAGNOSIS — Z23 Encounter for immunization: Secondary | ICD-10-CM

## 2013-06-10 LAB — COMPREHENSIVE METABOLIC PANEL
ALK PHOS: 61 U/L (ref 39–117)
ALT: 16 U/L (ref 0–35)
AST: 23 U/L (ref 0–37)
Albumin: 4.2 g/dL (ref 3.5–5.2)
BILIRUBIN TOTAL: 0.8 mg/dL (ref 0.2–1.2)
BUN: 11 mg/dL (ref 6–23)
CO2: 33 mEq/L — ABNORMAL HIGH (ref 19–32)
CREATININE: 0.77 mg/dL (ref 0.50–1.10)
Calcium: 9.5 mg/dL (ref 8.4–10.5)
Chloride: 100 mEq/L (ref 96–112)
GLUCOSE: 75 mg/dL (ref 70–99)
Potassium: 4.5 mEq/L (ref 3.5–5.3)
Sodium: 139 mEq/L (ref 135–145)
Total Protein: 6.4 g/dL (ref 6.0–8.3)

## 2013-06-10 LAB — CBC WITH DIFFERENTIAL/PLATELET
Basophils Absolute: 0 10*3/uL (ref 0.0–0.1)
Basophils Relative: 1 % (ref 0–1)
EOS ABS: 0.1 10*3/uL (ref 0.0–0.7)
Eosinophils Relative: 4 % (ref 0–5)
HEMATOCRIT: 41.6 % (ref 36.0–46.0)
HEMOGLOBIN: 14.5 g/dL (ref 12.0–15.0)
Lymphocytes Relative: 30 % (ref 12–46)
Lymphs Abs: 1 10*3/uL (ref 0.7–4.0)
MCH: 31.7 pg (ref 26.0–34.0)
MCHC: 34.9 g/dL (ref 30.0–36.0)
MCV: 91 fL (ref 78.0–100.0)
MONO ABS: 0.3 10*3/uL (ref 0.1–1.0)
MONOS PCT: 8 % (ref 3–12)
Neutro Abs: 1.9 10*3/uL (ref 1.7–7.7)
Neutrophils Relative %: 57 % (ref 43–77)
Platelets: 220 10*3/uL (ref 150–400)
RBC: 4.57 MIL/uL (ref 3.87–5.11)
RDW: 13.1 % (ref 11.5–15.5)
WBC: 3.3 10*3/uL — ABNORMAL LOW (ref 4.0–10.5)

## 2013-06-10 LAB — LIPID PANEL
CHOL/HDL RATIO: 2.4 ratio
CHOLESTEROL: 191 mg/dL (ref 0–200)
HDL: 79 mg/dL (ref >39)
LDL Cholesterol: 96 mg/dL (ref 0–99)
Triglycerides: 82 mg/dL (ref <150)
VLDL: 16 mg/dL (ref 0–40)

## 2013-06-10 LAB — POCT URINALYSIS DIPSTICK
BILIRUBIN UA: NEGATIVE
Blood, UA: NEGATIVE
Glucose, UA: NEGATIVE
KETONES UA: NEGATIVE
LEUKOCYTES UA: NEGATIVE
Nitrite, UA: NEGATIVE
Protein, UA: NEGATIVE
Urobilinogen, UA: NEGATIVE
pH, UA: 8

## 2013-06-10 LAB — MAGNESIUM: Magnesium: 2 mg/dL (ref 1.5–2.5)

## 2013-06-10 MED ORDER — SERTRALINE HCL 100 MG PO TABS: ORAL_TABLET | ORAL | Status: DC

## 2013-06-10 MED ORDER — FLUTICASONE PROPIONATE 50 MCG/ACT NA SUSP: NASAL | Status: DC

## 2013-06-10 MED ORDER — TRAZODONE HCL 50 MG PO TABS: ORAL_TABLET | ORAL | Status: DC

## 2013-06-10 NOTE — Patient Instructions (Signed)
  HEALTH MAINTENANCE RECOMMENDATIONS:  It is recommended that you get at least 30 minutes of aerobic exercise at least 5 days/week (for weight loss, you may need as much as 60-90 minutes). This can be any activity that gets your heart rate up. This can be divided in 10-15 minute intervals if needed, but try and build up your endurance at least once a week.  Weight bearing exercise is also recommended twice weekly.  Eat a healthy diet with lots of vegetables, fruits and fiber.  "Colorful" foods have a lot of vitamins (ie green vegetables, tomatoes, red peppers, etc).  Limit sweet tea, regular sodas and alcoholic beverages, all of which has a lot of calories and sugar.  Up to 1 alcoholic drink daily may be beneficial for women (unless trying to lose weight, watch sugars).  Drink a lot of water.  Calcium recommendations are 1200-1500 mg daily (1500 mg for postmenopausal women or women without ovaries), and vitamin D 1000 IU daily.  This should be obtained from diet and/or supplements (vitamins), and calcium should not be taken all at once, but in divided doses.  Monthly self breast exams and yearly mammograms for women over the age of 42 is recommended.  Sunscreen of at least SPF 30 should be used on all sun-exposed parts of the skin when outside between the hours of 10 am and 4 pm (not just when at beach or pool, but even with exercise, golf, tennis, and yard work!)  Use a sunscreen that says "broad spectrum" so it covers both UVA and UVB rays, and make sure to reapply every 1-2 hours.  Remember to change the batteries in your smoke detectors when changing your clock times in the spring and fall.  Use your seat belt every time you are in a car, and please drive safely and not be distracted with cell phones and texting while driving.  Shingles vaccine (zostavax) is recommended.  Check with your insurnace to determine coverage/cost.  If desired, return for nurse visit--vaccine must be separated from all  other vaccines (the ones given today and your flu shot) by at least 30 days.  You will need a pneumovax in 6-12 months (okay to wait until your next physical).

## 2013-06-10 NOTE — Progress Notes (Signed)
Chief Complaint  Patient presents with  . Annual Exam    nonfasting annual exam(blood is in our lab) without gyn-sees [facility] and is UTD. Did not do eye exam as she wnet to [place] last week for exam.     Stephanie Harrell is a 68 y.o. female who presents for a complete physical.  She has the following concerns:  She needs med refills, and follow-up on her depression, insomnia, allergies  Depression:  Moods are well controlled on sertraline.  Denies side effects.   Insomnia:  Well controlled with just 25mg  of trazadone each night.  Denies side effects Allergies:  She has perennial allergies related to her work environment, as well as seasonal component.  She reports that her allergies are controlled with her current regimen.  She uses 12 hr claritin, and uses 1-2/day. She needs refill on Flonase. GERD:  Symptoms are controlled with 30mg  of Prevacid daily.  She finds that she has excessive oral secretions/phlegm in her mouth, when she doesn't take it  Immunization History  Administered Date(s) Administered  . DTaP 03/04/2004  . Influenza Split 12/27/2010, 12/03/2011, 01/02/2013   Last Pap smear: 09/2011; last GYN exam was summer 2014 with [facility] Last mammogram: 2-3 years ago Last colonoscopy: 09/2005 Dr. Sammuel Cooper, normal  Last DEXA: 2009 Dentist: twice yearly (Dr. Jetta Lout) Ophtho: every 2 years, went recently ([place] ophtho) Exercise:  She is very active at work, walks, "runs around all day".  Lifts some furniture, but no regular weight-bearing activity.  Pilates once a week.  Past Medical History  Diagnosis Date  . Depressive disorder, not elsewhere classified   . Insomnia   . Mitral valve prolapse   . GERD (gastroesophageal reflux disease)   . Allergic rhinitis, cause unspecified     mold, mildew, and seasonal allergies  . Endometriosis   . Postmenopausal   . Fatigue   . 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 Anxiety   .  WBC decreased 5/06    borderline (3.5-4.0)  . Frequent UTI     h/o  . MVP (mitral valve prolapse)     h/o  . FHx: colon cancer   . FHx: breast cancer     Past Surgical History  Procedure Laterality Date  . Laparoscopy  1978    (prior to getting pregnant, for endometriosis with mini lap with wedge resection of ovary    History   Social History  . Marital Status: Married    Spouse Name: N/A    Number of Children: 2  . Years of Education: N/A   Occupational History  . 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 Smoking status: Former Smoker    Quit date: 03/05/2011  . Smokeless tobacco: Never Used  . Alcohol Use: 5.0 oz/week    10 drink(s) per week     Comment: 2 glasses of wine most days of the week  . Drug Use: No  . Sexual Activity: Yes    Partners: Male    Birth Control/ Protection: Post-menopausal   Other Topics Concern  . Not on file   Social History Narrative   Divorced.  Amicable relationship with her ex-husband. Lives by herself. Works at [employer].  Artist (paints, draws).  She is moving to [facility], moving in with her boyfriend    Family History  Problem Relation Age of Onset  . Hypertension Mother   . Cancer  Mother     uterine cancer @ 15; Breast cancer at 22 and 77's  . Heart disease Mother     aortic stenosis; pacemaker  . Uterine cancer Mother   . Breast cancer Mother   . Heart disease Father   . Cancer Father     pancreatic  . Depression Father   . Depression Sister   . Depression Brother   . Heart failure Brother   . Cancer Paternal Aunt 49    breast  . Breast cancer Paternal Aunt   . Cancer Maternal Grandmother 36    colon cancer  . Colon cancer Maternal Grandmother    Outpatient Encounter Prescriptions as of 06/10/2013  Medication Sig Note  . Calcium-Vitamin D-Vitamin K [id number] MG-UNT-MCG CHEW Chew 3 each by mouth daily.   . fluticasone (FLONASE) 50 MCG/ACT nasal spray USE 2 SPRAYS INTO EACH NOSTRIL EVERY DAY   .  lansoprazole (PREVACID) 15 MG capsule Take 30 mg by mouth daily.     Marland Kitchen loratadine (CLARITIN) 10 MG tablet Take 10 mg by mouth daily.   . Multiple Vitamin (MULTIVITAMIN) tablet Take 1 tablet by mouth daily.   . Omega 3-6-9 Fatty Acids (OMEGA 3-6-9 COMPLEX PO) Take 3 capsules by mouth daily.   . sertraline (ZOLOFT) 100 MG tablet TAKE 1 & 1/2 TABLET BY MOUTH EVERY DAY   . traZODone (DESYREL) 50 MG tablet TAKE 1/2 TABLET BY MOUTH AT BEDTIME. YOU MAY TAKE A FULL TABLET AT BEDTIME IF NEEDED.   . [DISCONTINUED] fluticasone (FLONASE) 50 MCG/ACT nasal spray USE 2 SPRAYS INTO EACH NOSTRIL EVERY DAY   . [DISCONTINUED] sertraline (ZOLOFT) 100 MG tablet TAKE 1 & 1/2 TABLET BY MOUTH EVERY DAY   . [DISCONTINUED] traZODone (DESYREL) 50 MG tablet TAKE 1/2 TABLET BY MOUTH AT BEDTIME. YOU MAY TAKE A FULL TABLET AT BEDTIME IF NEEDED.   Marland Kitchen nitrofurantoin (MACRODANTIN) 100 MG capsule Take 100 mg by mouth as needed. 04/21/2012: Uses after intercourse to prevent UTI's  . pseudoephedrine (SUDAFED) 30 MG tablet Take 30 mg by mouth every 4 (four) hours as needed for congestion. 06/10/2013: Uses prn sinus pain  . [DISCONTINUED] azithromycin (ZITHROMAX) 250 MG tablet Take 2 tablets by mouth today, then one tablet daily for the next 4 days. Refill if not better on day 11   . [DISCONTINUED] calcium carbonate 200 MG capsule Take 250 mg by mouth 2 (two) times daily with a meal.   . [DISCONTINUED] cholecalciferol (VITAMIN D) 1000 UNITS tablet Take 1,000 Units by mouth daily.   . [DISCONTINUED] fish oil-omega-3 fatty acids 1000 MG capsule Take 2 g by mouth daily.   . [DISCONTINUED] sertraline (ZOLOFT) 100 MG tablet TAKE 1 & 1/2 TABLET BY MOUTH EVERY DAY     Allergies  Allergen Reactions  . Codeine Other (See Comments)    Feels faint.    ROS:  The patient denies anorexia, fever, weight changes, headaches,  vision changes, decreased hearing, ear pain, sore throat, breast concerns, palpitations, dizziness, syncope, dyspnea on  exertion, cough, swelling, nausea, vomiting, diarrhea, abdominal pain, melena, hematochezia, indigestion/heartburn, hematuria, incontinence, dysuria, vaginal bleeding, discharge, odor or itch, genital lesions, joint pains, numbness, tingling, weakness, tremor, suspicious skin lesions, depression, anxiety, abnormal bleeding/bruising, or enlarged lymph nodes. Allergies are controlled with her current regimen. Occasional sinus headache.   Sometimes she will get a sharp pain in her chest which she relates to her mitral valve "getting caught"--if too much stress or caffeine.  Resolves quickly.  No exertional chest pain. She  gets a lot of phlegm in her mouth if she doesn't take the PPI daily.  Denies dysphagia. Occasional constipaton. Moods and insomnia are well controlled. Occasional low back pain; has known curvature.  No problems with pain when her core is strong  PHYSICAL EXAM: BP 120/72  Pulse 68  Ht 5\' 8"  (1.727 m)  Wt 136 lb (61.689 kg)  BMI 20.68 kg/m2  General Appearance:    Alert, cooperative, no distress, appears stated age  Head:    Normocephalic, without obvious abnormality, atraumatic  Eyes:    PERRL, conjunctiva/corneas clear, EOM's intact, fundi    benign  Ears:    Normal TM's and external ear canals  Nose:   Nares normal, mucosa mildly edematous, pale, no purulent drainage or sinus tenderness  Throat:   Lips, mucosa, and tongue normal; teeth and gums normal  Neck:   Supple, no lymphadenopathy;  thyroid:  no   enlargement/tenderness/nodules; no carotid   bruit or JVD  Back:    Spine nontender, no curvature, ROM normal, no CVA     tenderness  Lungs:     Clear to auscultation bilaterally without wheezes, rales or     ronchi; respirations unlabored  Chest Wall:    No tenderness or deformity   Heart:    Regular rate and rhythm, S1 and S2 normal, no murmur, rub   or gallop  Breast Exam:    Deferred to GYN  Abdomen:     Soft, non-tender, nondistended, normoactive bowel sounds,     no masses, no hepatosplenomegaly  Genitalia:    Deferred to GYN     Extremities:   No clubbing, cyanosis or edema  Pulses:   2+ and symmetric all extremities  Skin:   Skin color, texture, turgor normal, no rashes or lesions  Lymph nodes:   Cervical, supraclavicular, and axillary nodes normal  Neurologic:   CNII-XII intact, normal strength, sensation and gait; reflexes 2+ and symmetric throughout          Psych:   Normal mood, affect, hygiene and grooming.    ASSESSMENT/PLAN:  Routine general medical examination at a health care facility - Plan: POCT Urinalysis Dipstick, Lipid panel, Comprehensive metabolic panel, CBC with Differential, Vit D  25 hydroxy (rtn osteoporosis monitoring), TSH, HIV antibody, Magnesium  GERD (gastroesophageal reflux disease) - controlled  Insomnia - controlled - Plan: traZODone (DESYREL) 50 MG tablet  Depressive disorder, not elsewhere classified - controlled - Plan: sertraline (ZOLOFT) 100 MG tablet  Other malaise and fatigue - Plan: Comprehensive metabolic panel, CBC with Differential, Vit D  25 hydroxy (rtn osteoporosis monitoring), TSH  Screening for lipoid disorders - Plan: Lipid panel  Encounter for long-term (current) use of other medications - Plan: Comprehensive metabolic panel, CBC with Differential, Vit D  25 hydroxy (rtn osteoporosis monitoring), Magnesium  Need for Tdap vaccination - Plan: Tdap vaccine greater than or equal to 7yo IM  Need for prophylactic vaccination against Streptococcus pneumoniae (pneumococcus) - Plan: Pneumococcal conjugate vaccine 13-valent  Allergic rhinitis, cause unspecified - controlled - Plan: fluticasone (FLONASE) 50 MCG/ACT nasal spray  Discussed monthly self breast exams and yearly mammograms after the age of 57; at least 30 minutes of aerobic activity at least 5 days/week; proper sunscreen use reviewed; healthy diet, including goals of calcium and vitamin D intake and alcohol recommendations (less than or equal to  1 drink/day) reviewed; regular seatbelt use; changing batteries in smoke detectors.  Immunization recommendations discussed--Tdap and Prevnar-13 given today.  zostavax recommended.  Risks/side effects  reviewed. Check insurance and return for [place] in over 30 days, if desired.  Yearly flu shots (high dose) recommended in fall.  Colonoscopy recommendations reviewed, due again 2017, sooner if hemoccult +  Pneumovax in 6-12 months (or at [facility] next year)  Pt will be moving to [facility] soon.  F/u 1 year, sooner prn.  Sign release if/when needs records transferred to new doctor closer to her new home.

## 2013-06-11 ENCOUNTER — Encounter: Payer: Self-pay | Admitting: Family Medicine

## 2013-06-11 LAB — VITAMIN D 25 HYDROXY (VIT D DEFICIENCY, FRACTURES): VIT D 25 HYDROXY: 55 ng/mL (ref 30–89)

## 2013-06-11 LAB — HIV ANTIBODY (ROUTINE TESTING W REFLEX): HIV: NONREACTIVE

## 2013-06-11 LAB — TSH: TSH: 0.401 u[IU]/mL (ref 0.350–4.500)

## 2013-08-17 ENCOUNTER — Other Ambulatory Visit (INDEPENDENT_AMBULATORY_CARE_PROVIDER_SITE_OTHER): Payer: Medicare Other

## 2013-08-17 DIAGNOSIS — Z2911 Encounter for prophylactic immunotherapy for respiratory syncytial virus (RSV): Secondary | ICD-10-CM | POA: Diagnosis not present

## 2013-08-17 DIAGNOSIS — Z23 Encounter for immunization: Secondary | ICD-10-CM

## 2013-09-01 ENCOUNTER — Encounter: Payer: BC Managed Care – PPO | Admitting: Family Medicine

## 2013-09-30 ENCOUNTER — Ambulatory Visit (INDEPENDENT_AMBULATORY_CARE_PROVIDER_SITE_OTHER): Payer: Medicare Other | Admitting: Nurse Practitioner

## 2013-09-30 ENCOUNTER — Encounter: Payer: Self-pay | Admitting: Nurse Practitioner

## 2013-09-30 VITALS — BP 120/72 | HR 68 | Ht 68.0 in | Wt 138.0 lb

## 2013-09-30 DIAGNOSIS — Z01419 Encounter for gynecological examination (general) (routine) without abnormal findings: Secondary | ICD-10-CM | POA: Diagnosis not present

## 2013-09-30 DIAGNOSIS — Z124 Encounter for screening for malignant neoplasm of cervix: Secondary | ICD-10-CM

## 2013-09-30 DIAGNOSIS — E2839 Other primary ovarian failure: Secondary | ICD-10-CM | POA: Diagnosis not present

## 2013-09-30 DIAGNOSIS — Z0000 Encounter for general adult medical examination without abnormal findings: Secondary | ICD-10-CM | POA: Diagnosis not present

## 2013-09-30 LAB — POCT URINALYSIS DIPSTICK
Bilirubin, UA: NEGATIVE
Glucose, UA: NEGATIVE
KETONES UA: NEGATIVE
Leukocytes, UA: NEGATIVE
Nitrite, UA: NEGATIVE
PH UA: 7
PROTEIN UA: NEGATIVE
RBC UA: NEGATIVE
Urobilinogen, UA: NEGATIVE

## 2013-09-30 LAB — HEMOGLOBIN, FINGERSTICK: Hemoglobin, fingerstick: 14.2 g/dL (ref 12.0–16.0)

## 2013-09-30 NOTE — Patient Instructions (Signed)

## 2013-09-30 NOTE — Progress Notes (Signed)
Patient ID: Stephanie Harrell, female   DOB: 1945-06-09, 68 y.o.   MRN: [id number] 68 y.o. G3P2 Divorced Caucasian Fe here for annual exam.  New partner since the fall.  Now living together in [place].  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  He continues to work and is a Psychologist, educational.  Her mother age 84 died in 2022-03-08 this year.  Doing OK.   Patient's last menstrual period was 02/02/2003.          Sexually active: yes The current method of family planning is post menopausal status.  Exercising: yes walk and piliates daily Smoker: no   Health Maintenance:  Pap:  09/28/12, WNL, neg HR HPV  09/23/2011 Negative (prior Ascus with - HR HPV 2010)  MMG: 06/11/13, negative  Colonoscopy: 09/2005 normal repeat in 10 years (IFOB is being done with PCP) BMD: 09/2007 T Score: spine 2.6/ left femur neck -0.1 / left femur total 0.5 / left radius -0.8 TDaP: 2006  Labs:  HB:  14.2    Urine: negative     reports that she quit smoking about 2 years ago. She has never used smokeless tobacco. She reports that she drinks about 7 ounces of alcohol per week. She reports that she does not use illicit drugs.  Past Medical History  Diagnosis Date  . Depressive disorder, not elsewhere classified   . Insomnia   . Mitral valve prolapse   . GERD (gastroesophageal reflux disease)   . Allergic rhinitis, cause unspecified     mold, mildew, and seasonal allergies  . Endometriosis   . Postmenopausal   . Fatigue   . 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 Anxiety   . WBC decreased 5/06    borderline (3.5-4.0)  . Frequent UTI     h/o  . MVP (mitral valve prolapse)     h/o  . FHx: colon cancer   . FHx: breast cancer     Past Surgical History  Procedure Laterality Date  . Laparoscopy  1978    (prior to getting pregnant, for endometriosis with mini lap with wedge resection of ovary    Current Outpatient Prescriptions  Medication Sig Dispense  Refill  . Calcium-Vitamin D-Vitamin K [id number] MG-UNT-MCG CHEW Chew 3 each by mouth daily.      . fluticasone (FLONASE) 50 MCG/ACT nasal spray USE 2 SPRAYS INTO EACH NOSTRIL EVERY DAY  16 g  11  . lansoprazole (PREVACID) 15 MG capsule Take 30 mg by mouth daily.        Marland Kitchen loratadine (CLARITIN) 10 MG tablet Take 10 mg by mouth daily.      . Multiple Vitamin (MULTIVITAMIN) tablet Take 1 tablet by mouth daily.      . nitrofurantoin (MACRODANTIN) 100 MG capsule Take 100 mg by mouth as needed.      . Omega 3-6-9 Fatty Acids (OMEGA 3-6-9 COMPLEX PO) Take 3 capsules by mouth daily.      . pseudoephedrine (SUDAFED) 30 MG tablet Take 30 mg by mouth every 4 (four) hours as needed for congestion.      . sertraline (ZOLOFT) 100 MG tablet Take 125 mg by mouth daily.      . traZODone (DESYREL) 50 MG tablet TAKE 1/2 TABLET BY MOUTH AT BEDTIME. YOU MAY TAKE A FULL TABLET AT BEDTIME IF NEEDED.  30 tablet  11   No current facility-administered medications for this visit.  Family History  Problem Relation Age of Onset  . Hypertension Mother   . Cancer Mother     uterine cancer @ 28; Breast cancer at 71 and 1's  . Heart disease Mother     aortic stenosis; pacemaker  . Uterine cancer Mother   . Breast cancer Mother   . Heart disease Father   . Cancer Father     pancreatic  . Depression Father   . Depression Sister   . Depression Brother   . Heart failure Brother   . Cancer Paternal Aunt 90    breast  . Breast cancer Paternal Aunt   . Cancer Maternal Grandmother 82    colon cancer  . Colon cancer Maternal Grandmother     ROS:  Pertinent items are noted in HPI.  Otherwise, a comprehensive ROS was negative.  Exam:   BP 120/72  Pulse 68  Ht 5\' 8"  (1.727 m)  Wt 138 lb (62.596 kg)  BMI 20.99 kg/m2  LMP 02/02/2003 Height: 5\' 8"  (172.7 cm)  Ht Readings from Last 3 Encounters:  09/30/13 5\' 8"  (1.727 m)  06/10/13 5\' 8"  (1.727 m)  03/15/13 5' 8.5" (1.74 m)    General appearance: alert,  cooperative and appears stated age Head: Normocephalic, without obvious abnormality, atraumatic Neck: no adenopathy, supple, symmetrical, trachea midline and thyroid normal to inspection and palpation Lungs: clear to auscultation bilaterally Breasts: normal appearance, no masses or tenderness Heart: regular rate and rhythm Abdomen: soft, non-tender; no masses,  no organomegaly Extremities: extremities normal, atraumatic, no cyanosis or edema Skin: Skin color, texture, turgor normal. No rashes or lesions Lymph nodes: Cervical, supraclavicular, and axillary nodes normal. No abnormal inguinal nodes palpated Neurologic: Grossly normal   Pelvic: External genitalia:  no lesions              Urethra:  normal appearing urethra with no masses, tenderness or lesions              Bartholin's and Skene's: normal                 Vagina: normal appearing vagina with normal color and discharge, no lesions              Cervix: anteverted              Pap taken: No. Bimanual Exam:  Uterus:  normal size, contour, position, consistency, mobility, non-tender              Adnexa: no mass, fullness, tenderness               Rectovaginal: Confirms               Anus:  normal sphincter tone, no lesions  A:  Well Woman with normal exam  Postmenopausal  History of abnormal pap 2010  Situational depression  P:   Reviewed health and wellness pertinent to exam  Pap smear not taken today  (plan to repeat 2016)  Mammogram is due 4/16  Order for Dexa is placed  Counseled on breast self exam, mammography screening, adequate intake of calcium and vitamin D, diet and exercise, Kegel's exercises return annually or prn  An After Visit Summary was printed and given to the patient.

## 2013-09-30 NOTE — Progress Notes (Signed)
Encounter reviewed by Dr. Vernica Wachtel Silva.  

## 2013-10-11 ENCOUNTER — Telehealth: Payer: Self-pay | Admitting: Nurse Practitioner

## 2013-10-11 NOTE — Telephone Encounter (Signed)
Left msg for pt to call regarding insurance. Is bcbs her primary and medicare secondary? Medicare says bcbs is primary for patient.

## 2013-10-12 NOTE — Telephone Encounter (Signed)
Pt will call her employer and check on this.

## 2014-01-03 ENCOUNTER — Encounter: Payer: Self-pay | Admitting: Nurse Practitioner

## 2014-02-21 DIAGNOSIS — J018 Other acute sinusitis: Secondary | ICD-10-CM | POA: Diagnosis not present

## 2014-03-04 DIAGNOSIS — M1990 Unspecified osteoarthritis, unspecified site: Secondary | ICD-10-CM

## 2014-03-04 HISTORY — DX: Unspecified osteoarthritis, unspecified site: M19.90

## 2014-03-14 ENCOUNTER — Ambulatory Visit (INDEPENDENT_AMBULATORY_CARE_PROVIDER_SITE_OTHER): Payer: Medicare Other | Admitting: Family Medicine

## 2014-03-14 ENCOUNTER — Encounter: Payer: Self-pay | Admitting: Family Medicine

## 2014-03-14 VITALS — BP 108/72 | HR 80 | Temp 98.5°F | Ht 68.0 in | Wt 145.0 lb

## 2014-03-14 DIAGNOSIS — J0101 Acute recurrent maxillary sinusitis: Secondary | ICD-10-CM | POA: Diagnosis not present

## 2014-03-14 DIAGNOSIS — K219 Gastro-esophageal reflux disease without esophagitis: Secondary | ICD-10-CM

## 2014-03-14 MED ORDER — AMOXICILLIN-POT CLAVULANATE 875-125 MG PO TABS
1.0000 | ORAL_TABLET | Freq: Two times a day (BID) | ORAL | Status: DC
Start: 2014-03-14 — End: 2014-08-24

## 2014-03-14 NOTE — Progress Notes (Signed)
Chief Complaint  Patient presents with  . Facial Pain    seen on 12/21 at [facility] in [place], given abx not completely better. Still having a lot of sinus pain and lots of drainage. Mucus is mostly white but at times can be brownish. Feels more achy than usual. No fevers. Would like flu shot today if possible.    She was seen in [place] (in [facility]) 12/21 after 3 weeks of symptoms and severe sinus pain.  She was diagnosed with sinusitis and greated with augmentin for only 7 days. It helped, but symptoms didn't completely resolve.  Symptoms never completely cleared, and she is getting worse.  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  Mucus from nose is not discolored, but phlegm is white, with brown at the end.  She has a sore throat.  She continues to have pain in both cheeks.  +headaches, PND and slight cough.  Denies chest congestion or shortness of breath.  She is using Flonase, Neti-pot.  She uses some saline spray (took oxymetazoline for a short while, not currently), and using some local honey.  House is damp, and using dehumidifier.  She uses a decongestant periodically (not daily), and antihistamine prn for runny nose.    She has questions about long-term PPI use.  PMH, PSH and SH were reviewed and updated.  Outpatient Encounter Prescriptions as of 03/14/2014  Medication Sig Note  . Calcium-Vitamin D-Vitamin K [id number] MG-UNT-MCG CHEW Chew 3 each by mouth daily.   . fluticasone (FLONASE) 50 MCG/ACT nasal spray USE 2 SPRAYS INTO EACH NOSTRIL EVERY DAY   . lansoprazole (PREVACID) 15 MG capsule Take 30 mg by mouth daily.     Marland Kitchen loratadine (CLARITIN) 10 MG tablet Take 10 mg by mouth daily.   . Multiple Vitamin (MULTIVITAMIN) tablet Take 1 tablet by mouth daily.   . Omega 3-6-9 Fatty Acids (OMEGA 3-6-9 COMPLEX PO) Take 3 capsules by mouth daily.   . sertraline (ZOLOFT) 100 MG tablet Take 150 mg by mouth daily.    . traZODone (DESYREL) 50 MG tablet TAKE 1/2 TABLET BY MOUTH AT  BEDTIME. YOU MAY TAKE A FULL TABLET AT BEDTIME IF NEEDED.   Marland Kitchen nitrofurantoin (MACRODANTIN) 100 MG capsule Take 100 mg by mouth as needed. 04/21/2012: Uses after intercourse to prevent UTI's  . pseudoephedrine (SUDAFED) 30 MG tablet Take 30 mg by mouth every 4 (four) hours as needed for congestion. 06/10/2013: Uses prn sinus pain   Allergies  Allergen Reactions  . Codeine Other (See Comments)    Feels faint.   ROS:  No fevers, chills, chest pain, shortness of breath, nausea, vomiting, diarrhea, bleeding, bruising, rash, urinary complaints. +sinus pain as per HPI. No myalgias/arthralgias. Moods are good  PHYSICAL EXAM: BP 108/72 mmHg  Pulse 80  Temp(Src) 98.5 F (36.9 C) (Tympanic)  Ht 5\' 8"  (1.727 m)  Wt 145 lb (65.772 kg)  BMI 22.05 kg/m2  LMP 02/02/2003 Well developed, well appearing, pleasant female in no distress HEENT: PERRL, EOMI, conjunctiva clear.  TM's and EAC's normal. Nasal mucosa is mild-mod edematous with some yellow-green discolored mucus noted on right.  Mild erythema.  Tender over maxillary sinuses bilaterally.  OP without erythema or lesions Neck: No lymphadenopathy, thyromegaly or mass Heart: regular rate and rhythm, no murmur Lungs: clear bilaterally Abdomen: nontender Extremties: no edema Psych: normal mood, affect, hygiene and grooming Neuro: normal cranial nerves, strength, gait  ASSESSMENT/PLAN:  Acute recurrent maxillary sinusitis - Plan: amoxicillin-clavulanate (AUGMENTIN) 875-125 MG per tablet  Gastroesophageal reflux disease, esophagitis presence not specified - dietary and behavioral precautions reviewed. risks of untreated GERD vs long-term PPI reviewed in detail. Rec dietary changes and trial taper med  Discussed longterm effects of PPI vs untreated GERD. Encouraged to try and change diet (cut back on alcohol) so that lower dose can be used. Discussed calcium intake--taking 1500mg  just from supplements (viactiv).  Advised to cut to 2/day, and have the  balance through diet.   Continue to use decongestant as needed for sinus pain. Consider using Mucinex (guaifenesin) to keep the mucus thin. Continue Neti-pot and nasal saline.  Call in 5-7 days if worse, to change antibiotic. Call after 10 days if your symptoms have improved, but not completely (to extend the course further).

## 2014-03-14 NOTE — Patient Instructions (Signed)
  Continue to use decongestant as needed for sinus pain. Consider using Mucinex (guaifenesin) to keep the mucus thin. Continue Neti-pot and nasal saline.  Call in 5-7 days if worse, to change antibiotic. Call after 10 days if your symptoms have improved, but not completely (to extend the course further).  Try and cut back on wine, spicy and acidic foods, and caffeine in order to limit reflux so that you can potentially cut back on your medication to treat reflux. (the medication is NOT as bad as untreated reflux; but if you don't need to take medication because reflux is better with dietary changes, then don't take it daily if uneccessary.)

## 2014-05-05 ENCOUNTER — Telehealth: Payer: Self-pay | Admitting: Internal Medicine

## 2014-05-05 MED ORDER — NITROFURANTOIN MACROCRYSTAL 100 MG PO CAPS: 100.0000 mg | ORAL_CAPSULE | ORAL | Status: DC | PRN

## 2014-05-05 NOTE — Telephone Encounter (Signed)
Pt needs a refill on macrodantin 100mg  [facility] in [place]

## 2014-05-05 NOTE — Telephone Encounter (Signed)
done

## 2014-05-09 ENCOUNTER — Telehealth: Payer: Self-pay | Admitting: Family Medicine

## 2014-05-09 ENCOUNTER — Other Ambulatory Visit: Payer: Self-pay | Admitting: Family Medicine

## 2014-05-09 MED ORDER — SERTRALINE HCL 100 MG PO TABS: 150.0000 mg | ORAL_TABLET | Freq: Every day | ORAL | Status: DC

## 2014-05-09 NOTE — Telephone Encounter (Signed)
Pt called stating that she needs refill on Sertraline 100MG  today if possible because [place] is going out of [place] in the morning

## 2014-05-09 NOTE — Telephone Encounter (Signed)
done

## 2014-05-27 DIAGNOSIS — L821 Other seborrheic keratosis: Secondary | ICD-10-CM | POA: Diagnosis not present

## 2014-05-27 DIAGNOSIS — L089 Local infection of the skin and subcutaneous tissue, unspecified: Secondary | ICD-10-CM | POA: Diagnosis not present

## 2014-05-27 DIAGNOSIS — L918 Other hypertrophic disorders of the skin: Secondary | ICD-10-CM | POA: Diagnosis not present

## 2014-05-27 DIAGNOSIS — L570 Actinic keratosis: Secondary | ICD-10-CM | POA: Insufficient documentation

## 2014-05-27 DIAGNOSIS — L700 Acne vulgaris: Secondary | ICD-10-CM | POA: Diagnosis not present

## 2014-05-27 DIAGNOSIS — L409 Psoriasis, unspecified: Secondary | ICD-10-CM | POA: Diagnosis not present

## 2014-06-17 ENCOUNTER — Telehealth: Payer: Self-pay | Admitting: Nurse Practitioner

## 2014-06-17 NOTE — Telephone Encounter (Signed)
LMTCB about canceled appointment with PG

## 2014-08-04 ENCOUNTER — Other Ambulatory Visit: Payer: Self-pay | Admitting: Family Medicine

## 2014-08-04 NOTE — Telephone Encounter (Signed)
Left message for patient to return my call.

## 2014-08-04 NOTE — Telephone Encounter (Signed)
Last physical was 06/2013, and she doesn't have one scheduled.  Seen for acute visit 03/2014.  She at least needs to schedule med check (if not CPE, I think she sees separate GYN?) or consider finding a doctor in [place] since she moved.  Refill up to 3 months, until appt, no additional without visit

## 2014-08-04 NOTE — Telephone Encounter (Signed)
Is this okay?

## 2014-08-11 ENCOUNTER — Telehealth: Payer: Self-pay | Admitting: Family Medicine

## 2014-08-11 DIAGNOSIS — G4700 Insomnia, unspecified: Secondary | ICD-10-CM

## 2014-08-11 MED ORDER — TRAZODONE HCL 50 MG PO TABS: ORAL_TABLET | ORAL | Status: DC

## 2014-08-11 MED ORDER — SERTRALINE HCL 100 MG PO TABS: 150.0000 mg | ORAL_TABLET | Freq: Every day | ORAL | Status: DC

## 2014-08-11 NOTE — Telephone Encounter (Signed)
Pt has a med check on 6/22 and wants to know if you will go ahead and refill her Zoloft and Trazadone to [facility] [address] in [place]?

## 2014-08-11 NOTE — Telephone Encounter (Signed)
done

## 2014-08-11 NOTE — Telephone Encounter (Signed)
Okay to refill both meds for 30 days

## 2014-08-11 NOTE — Telephone Encounter (Signed)
Filled already.

## 2014-08-24 ENCOUNTER — Encounter: Payer: Self-pay | Admitting: Family Medicine

## 2014-08-24 ENCOUNTER — Ambulatory Visit (INDEPENDENT_AMBULATORY_CARE_PROVIDER_SITE_OTHER): Payer: Medicare Other | Admitting: Family Medicine

## 2014-08-24 VITALS — BP 124/80 | HR 72 | Ht 68.0 in | Wt 148.8 lb

## 2014-08-24 DIAGNOSIS — G4700 Insomnia, unspecified: Secondary | ICD-10-CM

## 2014-08-24 DIAGNOSIS — F324 Major depressive disorder, single episode, in partial remission: Secondary | ICD-10-CM | POA: Diagnosis not present

## 2014-08-24 DIAGNOSIS — R7989 Other specified abnormal findings of blood chemistry: Secondary | ICD-10-CM

## 2014-08-24 DIAGNOSIS — F325 Major depressive disorder, single episode, in full remission: Secondary | ICD-10-CM

## 2014-08-24 DIAGNOSIS — Z23 Encounter for immunization: Secondary | ICD-10-CM

## 2014-08-24 DIAGNOSIS — J309 Allergic rhinitis, unspecified: Secondary | ICD-10-CM | POA: Diagnosis not present

## 2014-08-24 DIAGNOSIS — R799 Abnormal finding of blood chemistry, unspecified: Secondary | ICD-10-CM | POA: Diagnosis not present

## 2014-08-24 DIAGNOSIS — K5900 Constipation, unspecified: Secondary | ICD-10-CM

## 2014-08-24 DIAGNOSIS — Z5181 Encounter for therapeutic drug level monitoring: Secondary | ICD-10-CM

## 2014-08-24 LAB — CBC WITH DIFFERENTIAL/PLATELET
BASOS PCT: 0 % (ref 0–1)
Basophils Absolute: 0 10*3/uL (ref 0.0–0.1)
EOS PCT: 2 % (ref 0–5)
Eosinophils Absolute: 0.1 10*3/uL (ref 0.0–0.7)
HCT: 41 % (ref 36.0–46.0)
HEMOGLOBIN: 14.1 g/dL (ref 12.0–15.0)
Lymphocytes Relative: 34 % (ref 12–46)
Lymphs Abs: 1.5 10*3/uL (ref 0.7–4.0)
MCH: 32 pg (ref 26.0–34.0)
MCHC: 34.4 g/dL (ref 30.0–36.0)
MCV: 93 fL (ref 78.0–100.0)
MPV: 9.5 fL (ref 8.6–12.4)
Monocytes Absolute: 0.3 10*3/uL (ref 0.1–1.0)
Monocytes Relative: 6 % (ref 3–12)
NEUTROS PCT: 58 % (ref 43–77)
Neutro Abs: 2.6 10*3/uL (ref 1.7–7.7)
Platelets: 219 10*3/uL (ref 150–400)
RBC: 4.41 MIL/uL (ref 3.87–5.11)
RDW: 12.4 % (ref 11.5–15.5)
WBC: 4.5 10*3/uL (ref 4.0–10.5)

## 2014-08-24 LAB — COMPREHENSIVE METABOLIC PANEL
ALT: 11 U/L (ref 0–35)
AST: 18 U/L (ref 0–37)
Albumin: 4.4 g/dL (ref 3.5–5.2)
Alkaline Phosphatase: 66 U/L (ref 39–117)
BILIRUBIN TOTAL: 0.4 mg/dL (ref 0.2–1.2)
BUN: 15 mg/dL (ref 6–23)
CO2: 29 meq/L (ref 19–32)
Calcium: 9.3 mg/dL (ref 8.4–10.5)
Chloride: 102 mEq/L (ref 96–112)
Creat: 0.7 mg/dL (ref 0.50–1.10)
Glucose, Bld: 89 mg/dL (ref 70–99)
Potassium: 4.1 mEq/L (ref 3.5–5.3)
Sodium: 140 mEq/L (ref 135–145)
Total Protein: 6.5 g/dL (ref 6.0–8.3)

## 2014-08-24 LAB — TSH: TSH: 0.387 u[IU]/mL (ref 0.350–4.500)

## 2014-08-24 MED ORDER — SERTRALINE HCL 100 MG PO TABS: 150.0000 mg | ORAL_TABLET | Freq: Every day | ORAL | Status: DC

## 2014-08-24 MED ORDER — TRAZODONE HCL 50 MG PO TABS: ORAL_TABLET | ORAL | Status: DC

## 2014-08-24 MED ORDER — FLUTICASONE PROPIONATE 50 MCG/ACT NA SUSP: NASAL | Status: DC

## 2014-08-24 NOTE — Patient Instructions (Signed)
Please get yearly flu shots. Consider trying Miralax to help you with your intermittent constipation (along with the stool softener, adequate water intake and high fiber diet.   Constipation Constipation is when a person has fewer than three bowel movements a week, has difficulty having a bowel movement, or has stools that are dry, hard, or larger than normal. As people grow older, constipation is more common. If you try to fix constipation with medicines that make you have a bowel movement (laxatives), the problem may get worse. Long-term laxative use may cause the muscles of the colon to become weak. A low-fiber diet, not taking in enough fluids, and taking certain medicines may make constipation worse.  CAUSES   Certain medicines, such as antidepressants, pain medicine, iron supplements, antacids, and water pills.   Certain diseases, such as diabetes, irritable bowel syndrome (IBS), thyroid disease, or depression.   Not drinking enough water.   Not eating enough fiber-rich foods.   Stress or travel.   Lack of physical activity or exercise.   Ignoring the urge to have a bowel movement.   Using laxatives too much.  SIGNS AND SYMPTOMS   Having fewer than three bowel movements a week.   Straining to have a bowel movement.   Having stools that are hard, dry, or larger than normal.   Feeling full or bloated.   Pain in the lower abdomen.   Not feeling relief after having a bowel movement.  DIAGNOSIS  Your health care provider will take a medical history and perform a physical exam. Further testing may be done for severe constipation. Some tests may include:  A barium enema X-ray to examine your rectum, colon, and, sometimes, your small intestine.   A sigmoidoscopy to examine your lower colon.   A colonoscopy to examine your entire colon. TREATMENT  Treatment will depend on the severity of your constipation and what is causing it. Some dietary treatments  include drinking more fluids and eating more fiber-rich foods. Lifestyle treatments may include regular exercise. If these diet and lifestyle recommendations do not help, your health care provider may recommend taking over-the-counter laxative medicines to help you have bowel movements. Prescription medicines may be prescribed if over-the-counter medicines do not work.  HOME CARE INSTRUCTIONS   Eat foods that have a lot of fiber, such as fruits, vegetables, whole grains, and beans.  Limit foods high in fat and processed sugars, such as french fries, hamburgers, cookies, candies, and soda.   A fiber supplement may be added to your diet if you cannot get enough fiber from foods.   Drink enough fluids to keep your urine clear or pale yellow.   Exercise regularly or as directed by your health care provider.   Go to the restroom when you have the urge to go. Do not hold it.   Only take over-the-counter or prescription medicines as directed by your health care provider. Do not take other medicines for constipation without talking to your health care provider first.  Bend IF:   You have bright red blood in your stool.   Your constipation lasts for more than 4 days or gets worse.   You have abdominal or rectal pain.   You have thin, pencil-like stools.   You have unexplained weight loss. MAKE SURE YOU:   Understand these instructions.  Will watch your condition.  Will get help right away if you are not doing well or get worse. Document Released: 11/17/2003 Document Revised: 02/23/2013 Document Reviewed:  11/30/2012 ExitCare Patient Information 2015 [place], [place]. This information is not intended to replace advice given to you by your health care provider. Make sure you discuss any questions you have with your health care provider.

## 2014-08-24 NOTE — Progress Notes (Signed)
Chief Complaint  Patient presents with  . Depression    nonfasting med check.    She presents for med check.  She is not scheduled for CPE this year; plans to see GYN.  She has a complaint of constipation.    She eats a lot of vegetables.  She has been taking stool softeners daily for the last few months, which has helped. As long as she takes them daily, and eats a lot of fruit/vegetables.  However, she just got home from a trip, planning another trip, and she feels herself getting bloated and constipated. She is asking what else she can do, to get her symptoms under control  Depression: Moods are well controlled on sertraline. Denies side effects. She cut back to 1-1/4 tablets over the last month; hasn't noticed any difference on the lower dose. She is wanting to continue tapering down the dose.  Insomnia: Well controlled with just 109m of trazadone each night. Denies side effects. She doesn't think she could do without the trazadone for sleep.  Allergies: She has perennial allergies--not as bad since she isn't working in dusty environment, but she does live in an old home; she also has a seasonal component to her allergies. She reports that her allergies are controlled with her current regimen. She Flonase and neti-pot daily. She only uses the claritin and decongestant prn.  GERD: Symptoms are controlled with OTC Prevacid daily. She finds that she has excessive oral secretions/phlegm in her mouth, when she doesn't take it.  Limiting her wine and spicy foods have allowed her to cut back to just 145mdaily (she will take 2 if she knows she is having big/spicy meal). Denies dysphagia.  H/o frequent UTI's.  Uses nitrofurantoin after intercourse for prevention of infections.  Thinks she has only taken one since Christmas. She had #30 refilled in March, with add'l refill.  Denies any current urinary complaints.  [facility] is past due for [facility]  Immunization History  Administered Date(s) Administered  . DTaP 03/04/2004  . Influenza Split 12/27/2010, 12/03/2011, 01/02/2013  . Pneumococcal Conjugate-13 06/10/2013  . Pneumococcal Polysaccharide-23 08/24/2014  . Tdap 06/10/2013  . Zoster 08/17/2013   PMH, PSH, SH, FH reviewed and updated.  Outpatient Encounter Prescriptions as of 08/24/2014  Medication Sig Note  . B Complex Vitamins (VITAMIN B COMPLEX PO) Take 1 tablet by mouth daily.  08/24/2014: Received from: [facility]. Calcium-Vitamin D-Vitamin K [id number]-UNT-MCG CHEW Chew 3 each by mouth daily.   . fluticasone (FLONASE) 50 MCG/ACT nasal spray USE 2 SPRAYS INTO EACH NOSTRIL EVERY DAY   . lansoprazole (PREVACID) 15 MG capsule Take 30 mg by mouth daily.   08/24/2014: Takes 1-2 daily, depending on dietary intake  . loratadine (CLARITIN) 10 MG tablet Take 10 mg by mouth daily. 08/24/2014: Uses prn  . Multiple Vitamin (MULTIVITAMIN) tablet Take 1 tablet by mouth daily.   . Omega 3-6-9 Fatty Acids (OMEGA 3-6-9 COMPLEX PO) Take 3 capsules by mouth daily.   . sertraline (ZOLOFT) 100 MG tablet Take 1.5 tablets (150 mg total) by mouth daily.   . traZODone (DESYREL) 50 MG tablet TAKE 1/2 TABLET BY MOUTH AT BEDTIME. YOU MAY TAKE A FULL TABLET AT BEDTIME IF NEEDED.   . [DISCONTINUED] fluticasone (FLONASE) 50 MCG/ACT nasal spray USE 2 SPRAYS INTO EACH NOSTRIL EVERY DAY   . [DISCONTINUED] sertraline (ZOLOFT) 100 MG tablet Take 1.5 tablets (150 mg total)  by mouth daily. 08/24/2014: 1.25 tablets daily  . [DISCONTINUED] traZODone (DESYREL) 50 MG tablet TAKE 1/2 TABLET BY MOUTH AT BEDTIME. YOU MAY TAKE A FULL TABLET AT BEDTIME IF NEEDED.   . clobetasol (TEMOVATE) 0.05 % external solution APPLY TO AFFECTED AREAS ON SCALP 2X DAILY X 2-4 WEEKS PRN FLARES 08/24/2014: Received from: External Pharmacy  . nitrofurantoin (MACRODANTIN) 100 MG capsule Take 1 capsule (100 mg total) by mouth as needed. (Patient not taking:  Reported on 08/24/2014) 08/24/2014: Uses after intercourse, but hasn't been using regularly  . pseudoephedrine (SUDAFED) 30 MG tablet Take 30 mg by mouth every 4 (four) hours as needed for congestion. 06/10/2013: Uses prn sinus pain  . tretinoin (RETIN-A) 0.025 % cream Apply thin layer to face once nightly as tolerated 08/24/2014: Received from: [facility]  . [DISCONTINUED] amoxicillin-clavulanate (AUGMENTIN) 875-125 MG per tablet Take 1 tablet by mouth 2 (two) times daily.    No facility-administered encounter medications on file as of 08/24/2014.   Allergies  Allergen Reactions  . Codeine Other (See Comments)    Feels faint.   ROS:  No fever, chills, URI/allergy symptoms currently.  No headaches, dizziness, chest pain, palpitations, nausea, vomiting, blood in stool.  +constipation currently as per HPI.  Denies urinary complaints, vaginal discharge, breast concerns.  Moods are good.  Insomnia is controlled.  No bleeding ,bruising, rash or other concerns. No joint pains.  PHYSICAL EXAM: BP 124/80 mmHg  Pulse 72  Ht 5' 8"  (1.727 m)  Wt 148 lb 12.8 oz (67.495 kg)  BMI 22.63 kg/m2  LMP 02/02/2003  Well developed, pleasant female in no distress HEENT: PERRL, EOMI, conjunctiva clear.  TM's and EAC's normal. Nasal mucosa is normal. OP is clear Neck: no lymphadenopathy, thyromegaly or carotid bruit Heart: regular rate and rhythm without murmur Lungs: clear bilaterally Back: no CVA or spinal tenderness Abdomen: soft, nontender, no organomegaly or mass Extremities: no edema, 2+ pulse Skin: no rashes Neuro: alert and oriented.  Cranial nerves intact, normal strength, gait Psych: normal mood, affect, hygiene and grooming  ASSESSMENT/PLAN:  Insomnia - controlled - Plan: traZODone (DESYREL) 50 MG tablet  Allergic rhinitis, unspecified allergic rhinitis type - controlled with Flonase, Neti-pot and prn use of antihistamines and decongestants - Plan: fluticasone (FLONASE) 50  MCG/ACT nasal spray  Depression, major, in remission - discussed how to continue taper, if desired. Can call for 70m tablet if desires to taper down from 1074m- Plan: sertraline (ZOLOFT) 100 MG tablet, TSH  Abnormal CBC - Plan: CBC with Differential/Platelet  Medication monitoring encounter - Plan: Comprehensive metabolic panel  Immunization due - Plan: Pneumococcal polysaccharide vaccine 23-valent greater than or equal to 2yo subcutaneous/IM  Constipation, unspecified constipation type - likely related to change in diet with travel. continue high fiber diet, increase water intake. cont stool softener. Miralax prn vs other laxatives reviewed.   Constipation: discussed miralax vs stimulant laxatives in detail. Likely worsened related to travel, and discussed why.   Pneumovax today; risks/side effects reviewed. Flu shots recommended yearly (she missed it this past year) Colonoscopy due 2017  Discussed further taper of zoloft, if/when ready Continue other current medications.  CBC, c-met, TSH (nonfasting) Last ate 2 hours ago Last lipids were good.  F/u 1 year for CPE (she stated she has [employer], computer states straight Medicare--will have to see what her insurance is next year to know what type of visit to do--med check/AWV vs CPE)

## 2014-09-11 ENCOUNTER — Other Ambulatory Visit: Payer: Self-pay | Admitting: Family Medicine

## 2014-09-13 DIAGNOSIS — J0140 Acute pansinusitis, unspecified: Secondary | ICD-10-CM | POA: Diagnosis not present

## 2014-10-04 DIAGNOSIS — Z1231 Encounter for screening mammogram for malignant neoplasm of breast: Secondary | ICD-10-CM | POA: Diagnosis not present

## 2014-10-04 LAB — HM MAMMOGRAPHY

## 2014-10-05 ENCOUNTER — Encounter: Payer: Self-pay | Admitting: Internal Medicine

## 2014-10-06 ENCOUNTER — Ambulatory Visit: Payer: Medicare Other | Admitting: Nurse Practitioner

## 2014-10-10 DIAGNOSIS — J0140 Acute pansinusitis, unspecified: Secondary | ICD-10-CM | POA: Diagnosis not present

## 2014-10-10 DIAGNOSIS — Z23 Encounter for immunization: Secondary | ICD-10-CM | POA: Diagnosis not present

## 2014-10-20 ENCOUNTER — Encounter: Payer: Self-pay | Admitting: *Deleted

## 2014-11-23 ENCOUNTER — Ambulatory Visit: Payer: Medicare Other | Admitting: Nurse Practitioner

## 2014-12-28 ENCOUNTER — Telehealth: Payer: Self-pay

## 2014-12-28 ENCOUNTER — Ambulatory Visit (INDEPENDENT_AMBULATORY_CARE_PROVIDER_SITE_OTHER): Payer: Medicare Other | Admitting: Family Medicine

## 2014-12-28 ENCOUNTER — Ambulatory Visit
Admission: RE | Admit: 2014-12-28 | Discharge: 2014-12-28 | Disposition: A | Discharge status: Home / Self Care | Payer: Medicare Other | Source: Ambulatory Visit | Attending: Family Medicine | Admitting: Family Medicine

## 2014-12-28 ENCOUNTER — Encounter: Payer: Self-pay | Admitting: Family Medicine

## 2014-12-28 VITALS — BP 118/68 | HR 68 | Ht 68.0 in | Wt 147.0 lb

## 2014-12-28 DIAGNOSIS — M25561 Pain in right knee: Secondary | ICD-10-CM

## 2014-12-28 DIAGNOSIS — H524 Presbyopia: Secondary | ICD-10-CM | POA: Diagnosis not present

## 2014-12-28 DIAGNOSIS — H01001 Unspecified blepharitis right upper eyelid: Secondary | ICD-10-CM | POA: Diagnosis not present

## 2014-12-28 DIAGNOSIS — M25562 Pain in left knee: Secondary | ICD-10-CM | POA: Diagnosis not present

## 2014-12-28 DIAGNOSIS — H2513 Age-related nuclear cataract, bilateral: Secondary | ICD-10-CM | POA: Diagnosis not present

## 2014-12-28 DIAGNOSIS — H04123 Dry eye syndrome of bilateral lacrimal glands: Secondary | ICD-10-CM | POA: Diagnosis not present

## 2014-12-28 DIAGNOSIS — M25461 Effusion, right knee: Secondary | ICD-10-CM | POA: Diagnosis not present

## 2014-12-28 DIAGNOSIS — S83012A Lateral subluxation of left patella, initial encounter: Secondary | ICD-10-CM | POA: Diagnosis not present

## 2014-12-28 MED ORDER — NAPROXEN 500 MG PO TABS
500.0000 mg | ORAL_TABLET | Freq: Two times a day (BID) | ORAL | Status: DC
Start: 2014-12-28 — End: 2015-01-23

## 2014-12-28 NOTE — Telephone Encounter (Signed)
Called and told pt to call her other doc to get this refilled

## 2014-12-28 NOTE — Telephone Encounter (Signed)
We have not previously prescribed this medication, looks like it came from [facility]. Last rx was for 45g with 6 refills in 05/2014--did she ever fill/get refills?  Looks like she sees derm there (has appt for 05/2015).  I have no diagnosis for this medication, and often it requires prior authorization (which I cannot get without a diagnosis or reason).  Makes sense to come from her dermatologist if she is seeing one regularly.  (and if she didn't get her other rx filled, it should still be good).

## 2014-12-28 NOTE — Patient Instructions (Signed)
   Take the naproxen twice daily with food for at least 7 days.  You can take it for the full 2 weeks, if needed, but okay to stop if your symptoms are completely resolved. Ice the knee if it is swollen. Try and avoid excessive bending (ie do not bend more than 90 degrees). Avoid activities which increase pain.  Avoid weight-bearing activity. Ideally, a week of rest would be good, followed by activities that don't cause pain (ie bicycle, swimming). LISTEN TO YOUR BODY.  Take your prescribed anti-inflammatory medication with food; discontinue or cut back the dose if you develop stomach pain/discomfort/side effects.  Do not take other over-the-counter pain medications such as ibuprofen, advil, motrin, aleve, naproxen, Goody or BC powder at the same time.  Do not use longer than recommended.  It is okay to use acetaminophen (tylenol) along with this medication.  Go to [facility] ([address]).

## 2014-12-28 NOTE — Progress Notes (Signed)
Chief Complaint  Patient presents with  . Knee Pain    right is worse than left. Has always had issues. Left knee hurt while on she was on trip in June and knee gave out while mountain climbing. Went to [place] this month and her right leg was really hurting. Has nots stopped since. Ice helps.   . Medication Refill    would like to know if you would refill her Retin A.   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  Swelling would start at knee, and sometimes go into the lower leg.  She would have intermittent problems with L knee pain since then, but not often.  In June, she was mountain climbing with her daughter in [place]--while climbing high steps, left leg gave out and she fell.  She didn't have any swelling or any more pain than usual, and was able to complete her hiking/climbing.   She went to [place] 10/5-12.  When she arrived in [place], she had stiffness and swelling in the right knee.  She was walking a lot during the day, not too much discomfort, but was very sore in the evenings. Her pain is behind the right knee, and also along the medial knee. Denies giving way or locking of the knee.  She has been icing the knee at night, which helps.  She has tried some sporadic use of Aleve, which helps some.  She feels limited ROM on the right, where she can't fully flex the knee, due to swelling/tightness.  PMH, PSH, SH reviewed.  Current Outpatient Prescriptions on File Prior to Visit  Medication Sig Dispense Refill  . Calcium-Vitamin D-Vitamin K [id number] MG-UNT-MCG CHEW Chew 3 each by mouth daily.    . fluticasone (FLONASE) 50 MCG/ACT nasal spray USE 2 SPRAYS INTO EACH NOSTRIL EVERY DAY 16 g 11  . lansoprazole (PREVACID) 15 MG capsule Take 30 mg by mouth daily.      Marland Kitchen loratadine (CLARITIN) 10 MG tablet Take 10 mg by mouth daily.    . Multiple Vitamin (MULTIVITAMIN) tablet Take 1 tablet by mouth daily.    . Omega 3-6-9 Fatty Acids  (OMEGA 3-6-9 COMPLEX PO) Take 3 capsules by mouth daily.    . sertraline (ZOLOFT) 100 MG tablet Take 1.5 tablets (150 mg total) by mouth daily. (Patient taking differently: Take 100 mg by mouth daily. ) 45 tablet 11  . traZODone (DESYREL) 50 MG tablet TAKE 1/2 TABLET BY MOUTH AT BEDTIME. YOU MAY TAKE A FULL TABLET AT BEDTIME IF NEEDED. 30 tablet 11  . clobetasol (TEMOVATE) 0.05 % external solution APPLY TO AFFECTED AREAS ON SCALP 2X DAILY X 2-4 WEEKS PRN FLARES  2  . nitrofurantoin (MACRODANTIN) 100 MG capsule Take 1 capsule (100 mg total) by mouth as needed. (Patient not taking: Reported on 08/24/2014) 30 capsule 1  . pseudoephedrine (SUDAFED) 30 MG tablet Take 30 mg by mouth every 4 (four) hours as needed for congestion.    Marland Kitchen tretinoin (RETIN-A) 0.025 % cream Apply thin layer to face once nightly as tolerated     No current facility-administered medications on file prior to visit.   Allergies  Allergen Reactions  . Codeine Other (See Comments)    Feels faint.   ROS:  No fever, chills, URI symptoms, chest pain, nausea, vomiting, abdominal pain, urinary complaints, bleeding, bruising, rash or other complaints except as noted in HPI.  PHYSICAL EXAM: BP 118/68 mmHg  Pulse 68  Ht  5\' 8"  (1.727 m)  Wt 147 lb (66.679 kg)  BMI 22.36 kg/m2  LMP 02/02/2003   Well developed, pleasant female in no distress Right knee There is effusion noted on the right.  Swelling is also noted above the knee, and posterior to the knee. Slightly limited ROM due to the swelling.  Negative Lachman, McMurray, and drawer tests.  No pain with valgus and varus stress.  No joint line tenderness  Left knee--no swelling, normal exam.  ASSESSMENT/PLAN:  Knee pain, bilateral - Plan: DG Knee Complete 4 Views Left, DG Knee Complete 4 Views Right, naproxen (NAPROSYN) 500 MG tablet  Bilateral knee pain--currently with increased pain and swelling on the right.  Suspect effusion, cannot r/o  Suprapatellar bursitis. There is  also likely a small Baker's Cyst. No internal derangement noted. Suspect OA.   Trial of NSAIDs x 2 weeks.  Given that she lives out of town, will send for bilateral knee x-rays today (rather than driving back if not better in 1-2 weeks).  Pt didn't mention the Retin-A at time of her office visit--see the phone encounter.    Take the naproxen twice daily with food for at least 7 days.  You can take it for the full 2 weeks, if needed, but okay to stop if your symptoms are completely resolved. Ice the knee if it is swollen. Try and avoid excessive bending (ie do not bend more than 90 degrees). Avoid activities which increase pain.  Avoid weight-bearing activity. Ideally, a week of rest would be good, followed by activities that don't cause pain (ie bicycle, swimming). LISTEN TO YOUR BODY.  Take your prescribed anti-inflammatory medication with food; discontinue or cut back the dose if you develop stomach pain/discomfort/side effects.  Do not take other over-the-counter pain medications such as ibuprofen, advil, motrin, aleve, naproxen, Goody or BC powder at the same time.  Do not use longer than recommended.  It is okay to use acetaminophen (tylenol) along with this medication.

## 2014-12-28 NOTE — Telephone Encounter (Signed)
Refill request for RETIN-A 0.025% cream

## 2015-01-12 ENCOUNTER — Telehealth: Payer: Self-pay | Admitting: Family Medicine

## 2015-01-12 NOTE — Telephone Encounter (Signed)
She can see an orthopedist for evaluation.  Would she want to go somewhere near her, vs in [place]? She should use the naproxen when she is having swelling in the knee (like she did when she was here), but otherwise can try Tylenol Arthritis for the pain. And she could use both together, if needed (naproxen and tylenol)

## 2015-01-12 NOTE — Telephone Encounter (Signed)
Left message on patient's voicemail informing her of Dr.Knapp's response. Asked her to call me back if she would like to see an orthopedist and if so, where?

## 2015-01-12 NOTE — Telephone Encounter (Signed)
Pt says her knees are about the same. She has been taking the Naproxen but it is not helping much. She has some days where the pain is not too terribly bad and then some days when the pain is really day. Today the pain is really bad. What can be done now?

## 2015-01-23 ENCOUNTER — Telehealth: Payer: Self-pay | Admitting: Family Medicine

## 2015-01-23 ENCOUNTER — Other Ambulatory Visit: Payer: Self-pay | Admitting: Family Medicine

## 2015-01-23 DIAGNOSIS — M25562 Pain in left knee: Principal | ICD-10-CM

## 2015-01-23 DIAGNOSIS — M25561 Pain in right knee: Secondary | ICD-10-CM

## 2015-01-23 MED ORDER — NAPROXEN 500 MG PO TABS: 500.0000 mg | ORAL_TABLET | Freq: Two times a day (BID) | ORAL | Status: DC

## 2015-01-23 NOTE — Telephone Encounter (Signed)
Requesting refill on Naproxen 500mg . Pt out of med and already requested through pharmacy but having knee pain and need refill ASAP.

## 2015-01-23 NOTE — Telephone Encounter (Signed)
done

## 2015-02-14 DIAGNOSIS — M170 Bilateral primary osteoarthritis of knee: Secondary | ICD-10-CM | POA: Diagnosis not present

## 2015-02-20 DIAGNOSIS — G8929 Other chronic pain: Secondary | ICD-10-CM | POA: Diagnosis not present

## 2015-02-20 DIAGNOSIS — M25562 Pain in left knee: Secondary | ICD-10-CM | POA: Diagnosis not present

## 2015-02-20 DIAGNOSIS — M25561 Pain in right knee: Secondary | ICD-10-CM | POA: Diagnosis not present

## 2015-04-17 ENCOUNTER — Telehealth: Payer: Self-pay | Admitting: Nurse Practitioner

## 2015-04-17 ENCOUNTER — Ambulatory Visit: Payer: Medicare Other | Admitting: Nurse Practitioner

## 2015-04-17 NOTE — Telephone Encounter (Signed)
Left message on voicemail to call and reschedule cancelled appointment. °

## 2015-04-30 DIAGNOSIS — R51 Headache: Secondary | ICD-10-CM | POA: Diagnosis not present

## 2015-04-30 DIAGNOSIS — M542 Cervicalgia: Secondary | ICD-10-CM | POA: Diagnosis not present

## 2015-04-30 DIAGNOSIS — Z87828 Personal history of other (healed) physical injury and trauma: Secondary | ICD-10-CM | POA: Diagnosis not present

## 2015-05-10 ENCOUNTER — Telehealth: Payer: Self-pay | Admitting: Family Medicine

## 2015-05-10 ENCOUNTER — Ambulatory Visit: Payer: Medicare Other | Admitting: Nurse Practitioner

## 2015-05-10 DIAGNOSIS — F325 Major depressive disorder, single episode, in full remission: Secondary | ICD-10-CM

## 2015-05-10 DIAGNOSIS — G4700 Insomnia, unspecified: Secondary | ICD-10-CM

## 2015-05-10 MED ORDER — TRAZODONE HCL 50 MG PO TABS: ORAL_TABLET | ORAL | Status: DC

## 2015-05-10 MED ORDER — SERTRALINE HCL 100 MG PO TABS: 150.0000 mg | ORAL_TABLET | Freq: Every day | ORAL | Status: DC

## 2015-05-10 NOTE — Telephone Encounter (Signed)
Okay to refill once.  Has appt scheduled for June

## 2015-05-10 NOTE — Telephone Encounter (Signed)
Done

## 2015-05-10 NOTE — Telephone Encounter (Signed)
Rcvd refill request for Sertaline 100mg  #135 and Trazodone 50mg  #90

## 2015-05-22 DIAGNOSIS — L819 Disorder of pigmentation, unspecified: Secondary | ICD-10-CM | POA: Diagnosis not present

## 2015-05-23 ENCOUNTER — Encounter: Payer: Self-pay | Admitting: Nurse Practitioner

## 2015-05-23 ENCOUNTER — Ambulatory Visit (INDEPENDENT_AMBULATORY_CARE_PROVIDER_SITE_OTHER): Payer: Medicare Other | Admitting: Nurse Practitioner

## 2015-05-23 VITALS — BP 136/80 | HR 76 | Resp 14 | Ht 68.0 in | Wt 150.8 lb

## 2015-05-23 DIAGNOSIS — Z0000 Encounter for general adult medical examination without abnormal findings: Secondary | ICD-10-CM | POA: Diagnosis not present

## 2015-05-23 DIAGNOSIS — E559 Vitamin D deficiency, unspecified: Secondary | ICD-10-CM | POA: Diagnosis not present

## 2015-05-23 DIAGNOSIS — Z01419 Encounter for gynecological examination (general) (routine) without abnormal findings: Secondary | ICD-10-CM

## 2015-05-23 DIAGNOSIS — Z124 Encounter for screening for malignant neoplasm of cervix: Secondary | ICD-10-CM

## 2015-05-23 NOTE — Progress Notes (Signed)
Patient ID: Stephanie Harrell, female   DOB: 1945/11/14, 70 y.o.   MRN: [id number] 70 y.o. [id number] Divorced  Caucasian Fe here for annual exam.  No new problems other than OA both knees and does PT at home.  NSAID's with Naprosyn prn. 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  Same partner for 2 yrs.  Still works part time as Transport planner.  Patient's last menstrual period was 02/02/2003.          Sexually active: Yes.    The current method of family planning is post menopausal status.    Exercising: Yes.    stationary bike and pilates. Smoker:  Former;quit 2013  Health Maintenance: Pap:  09-28-12 Neg:Neg HR HPV MMG:  10-04-14 Density Cat.C/Neg/BiRads1:[facility] Colonoscopy:  09/2005 normal; next due 09/2015 BMD: 09/2007 T Score: spine 2.6/ left femur neck -0.1 / left femur total 0.5 / left radius -0.8  TDaP:  06/10/2013 Shingles: 08/17/13 Pneumonia: 06/10/13 Hep C:  Will do today Labs: PCP   reports that she quit smoking about 4 years ago. She has never used smokeless tobacco. She reports that she drinks about 6.0 oz of alcohol per week. She reports that she does not use illicit drugs.  Past Medical History  Diagnosis Date  . Depressive disorder, not elsewhere classified   . Insomnia   . Mitral valve prolapse   . GERD (gastroesophageal reflux disease)   . Allergic rhinitis, cause unspecified     mold, mildew, and seasonal allergies  . Endometriosis   . Postmenopausal   . Fatigue   . 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 Anxiety   . WBC decreased 5/06    borderline (3.5-4.0)  . Frequent UTI     h/o  . MVP (mitral valve prolapse)     h/o  . FHx: colon cancer   . FHx: breast cancer   . Arthritis 2016    bilateral knees    Past Surgical History  Procedure Laterality Date  . Laparoscopy  1978    (prior to getting pregnant, for endometriosis with mini lap with wedge resection of ovary    Current Outpatient Prescriptions  Medication Sig Dispense  Refill  . Calcium-Vitamin D-Vitamin K [id number] MG-UNT-MCG CHEW Chew 3 each by mouth daily.    . clobetasol (TEMOVATE) 0.05 % external solution APPLY TO AFFECTED AREAS ON SCALP 2X DAILY X 2-4 WEEKS PRN FLARES  2  . fluticasone (FLONASE) 50 MCG/ACT nasal spray USE 2 SPRAYS INTO EACH NOSTRIL EVERY DAY 16 g 11  . lansoprazole (PREVACID) 15 MG capsule Take 30 mg by mouth daily.      Marland Kitchen loratadine (CLARITIN) 10 MG tablet Take 10 mg by mouth daily.    . Multiple Vitamin (MULTIVITAMIN) tablet Take 1 tablet by mouth daily.    . nitrofurantoin (MACRODANTIN) 100 MG capsule Take 1 capsule (100 mg total) by mouth as needed. 30 capsule 1  . Omega 3-6-9 Fatty Acids (OMEGA 3-6-9 COMPLEX PO) Take 3 capsules by mouth daily.    . pseudoephedrine (SUDAFED) 30 MG tablet Take 30 mg by mouth every 4 (four) hours as needed for congestion.    . sertraline (ZOLOFT) 100 MG tablet Take 1.5 tablets (150 mg total) by mouth daily. 135 tablet 0  . traZODone (DESYREL) 50 MG tablet TAKE 1/2 TABLET BY MOUTH AT BEDTIME. YOU MAY TAKE A FULL TABLET AT BEDTIME IF NEEDED. 90 tablet 0  . tretinoin (RETIN-A) 0.025 % cream  Apply thin layer to face once nightly as tolerated     No current facility-administered medications for this visit.    Family History  Problem Relation Age of Onset  . Hypertension Mother   . Cancer Mother     uterine cancer @ 60; Breast cancer at 22 and 46's  . Heart disease Mother     aortic stenosis; pacemaker  . Uterine cancer Mother   . Breast cancer Mother   . Heart disease Father   . Cancer Father     pancreatic  . Depression Father   . Depression Sister   . Depression Brother   . Heart failure Brother   . Cancer Paternal Aunt 20    breast  . Breast cancer Paternal Aunt   . Cancer Maternal Grandmother 82    colon cancer  . Colon cancer Maternal Grandmother     ROS:  Pertinent items are noted in HPI.  Otherwise, a comprehensive ROS was negative.  Exam:   BP 136/80 mmHg  Pulse 76  Resp 14   Ht 5\' 8"  (1.727 m)  Wt 150 lb 12.8 oz (68.402 kg)  BMI 22.93 kg/m2  LMP 02/02/2003 Height: 5\' 8"  (172.7 cm) Ht Readings from Last 3 Encounters:  05/23/15 5\' 8"  (1.727 m)  12/28/14 5\' 8"  (1.727 m)  08/24/14 5\' 8"  (1.727 m)    General appearance: alert, cooperative and appears stated age Head: Normocephalic, without obvious abnormality, atraumatic Neck: no adenopathy, supple, symmetrical, trachea midline and thyroid normal to inspection and palpation Lungs: clear to auscultation bilaterally Breasts: normal appearance, no masses or tenderness Heart: regular rate and rhythm Abdomen: soft, non-tender; no masses,  no organomegaly Extremities: extremities normal, atraumatic, no cyanosis or edema Skin: Skin color, texture, turgor normal. No rashes or lesions Lymph nodes: Cervical, supraclavicular, and axillary nodes normal. No abnormal inguinal nodes palpated Neurologic: Grossly normal   Pelvic: External genitalia:  no lesions              Urethra:  normal appearing urethra with no masses, tenderness or lesions              Bartholin's and Skene's: normal                 Vagina: normal appearing vagina with normal color and discharge, no lesions              Cervix: anteverted              Pap taken: Yes.   Bimanual Exam:  Uterus:  normal size, contour, position, consistency, mobility, non-tender              Adnexa: no mass, fullness, tenderness               Rectovaginal: Confirms               Anus:  normal sphincter tone, no lesions  Chaperone present: yes  A:  Well Woman with normal exam  Postmenopausal no HRT [place] + colon and breast cancer history of ASCUS with negative HR HPV 2010  History of OA  History of Vit D deficiency   P:   Reviewed health and wellness pertinent to exam  Pap smear as above  Mammogram is due 10/2014  [facility]exa order [facility]  She plans on scheduling Colonoscopy for 09/2015  Follow with labs  Counseled on breast self  exam, mammography screening, adequate intake of calcium and vitamin D, diet and exercise, Kegel's exercises return annually  or prn  An After Visit Summary was printed and given to the patient.

## 2015-05-23 NOTE — Progress Notes (Signed)
Encounter reviewed by Dr. Brook Amundson C. Silva.  

## 2015-05-23 NOTE — Patient Instructions (Signed)

## 2015-05-24 LAB — HEPATITIS C ANTIBODY: HCV AB: NEGATIVE

## 2015-05-24 LAB — VITAMIN D 25 HYDROXY (VIT D DEFICIENCY, FRACTURES): VIT D 25 HYDROXY: 44 ng/mL (ref 30–100)

## 2015-05-25 ENCOUNTER — Encounter: Payer: Self-pay | Admitting: Nurse Practitioner

## 2015-05-25 LAB — IPS PAP SMEAR ONLY

## 2015-05-29 ENCOUNTER — Telehealth: Payer: Self-pay | Admitting: Emergency Medicine

## 2015-05-29 NOTE — Telephone Encounter (Signed)
Responded to patient via mychart:     Ms. Boileau,   My name is Olivia Mackie and I am a triage nurse with Kem Boroughs, FNP. She is out of the office and I am helping her with answering her Mychart messages.   Your pap smear result is normal. The "Marked/Severe Inflammation" notation is a benign (normal) result with women who are of post menopausal age and does not warrant any further treatment or evaluation. This result is not indicative of any disease or infection. Unfortunately there is not anything you can do to make any changes to the cervical cells other than to practice good health maintenance (which appears you are doing!) like quitting smoking, regular pap smears, exercising and a healthy diet.   Please let us know if we can be of further assistance.   Karen Chafe, BSN RN-BC

## 2015-05-29 NOTE — Telephone Encounter (Signed)
Responded to patient via mychart.  Routing to covering [employer] CNM.  Okay to close?

## 2015-05-29 NOTE — Telephone Encounter (Signed)
Chief Complaint  Patient presents with  . Advice Only    Patient sent mychart message    ----- Message ----- From: Lajuana Carry Sent: 05/25/2015 8:03 PM EDT To: Kem Boroughs, FNP Subject: Visit Follow-Up Question  Stephanie Harrell-  I read the lab results. On the Pap results was "Other Cytologic " findings: "Marked/Severe Inflammation." Is there anything I can do to help prevent/improve this?  Thanks for any explanation.  Stephanie Harrell.

## 2015-05-29 NOTE — Telephone Encounter (Signed)
Responded to patient via mychart.  Message to Stephanie Harrell CNM as covering via telephone encounter.  Will close mychart encounter.

## 2015-05-30 NOTE — Telephone Encounter (Signed)
Yes agreeable to response

## 2015-08-24 ENCOUNTER — Encounter: Payer: Self-pay | Admitting: Family Medicine

## 2015-08-24 ENCOUNTER — Ambulatory Visit (INDEPENDENT_AMBULATORY_CARE_PROVIDER_SITE_OTHER): Payer: Medicare Other | Admitting: Family Medicine

## 2015-08-24 VITALS — BP 120/80 | HR 76 | Ht 67.5 in | Wt 143.8 lb

## 2015-08-24 DIAGNOSIS — Z0000 Encounter for general adult medical examination without abnormal findings: Secondary | ICD-10-CM | POA: Diagnosis not present

## 2015-08-24 DIAGNOSIS — F325 Major depressive disorder, single episode, in full remission: Secondary | ICD-10-CM

## 2015-08-24 DIAGNOSIS — K219 Gastro-esophageal reflux disease without esophagitis: Secondary | ICD-10-CM

## 2015-08-24 DIAGNOSIS — J309 Allergic rhinitis, unspecified: Secondary | ICD-10-CM

## 2015-08-24 MED ORDER — SERTRALINE HCL 50 MG PO TABS: ORAL_TABLET | ORAL | Status: DC

## 2015-08-24 MED ORDER — FLUTICASONE PROPIONATE 50 MCG/ACT NA SUSP: NASAL | Status: DC

## 2015-08-24 NOTE — Patient Instructions (Signed)
  HEALTH MAINTENANCE RECOMMENDATIONS:  It is recommended that you get at least 30 minutes of aerobic exercise at least 5 days/week (for weight loss, you may need as much as 60-90 minutes). This can be any activity that gets your heart rate up. This can be divided in 10-15 minute intervals if needed, but try and build up your endurance at least once a week.  Weight bearing exercise is also recommended twice weekly.  Eat a healthy diet with lots of vegetables, fruits and fiber.  "Colorful" foods have a lot of vitamins (ie green vegetables, tomatoes, red peppers, etc).  Limit sweet tea, regular sodas and alcoholic beverages, all of which has a lot of calories and sugar.  Up to 1 alcoholic drink daily may be beneficial for women (unless trying to lose weight, watch sugars).  Drink a lot of water.  Calcium recommendations are 1200-1500 mg daily (1500 mg for postmenopausal women or women without ovaries), and vitamin D 1000 IU daily.  This should be obtained from diet and/or supplements (vitamins), and calcium should not be taken all at once, but in divided doses.  Monthly self breast exams and yearly mammograms for women over the age of 41 is recommended.  Sunscreen of at least SPF 30 should be used on all sun-exposed parts of the skin when outside between the hours of 10 am and 4 pm (not just when at beach or pool, but even with exercise, golf, tennis, and yard work!)  Use a sunscreen that says "broad spectrum" so it covers both UVA and UVB rays, and make sure to reapply every 1-2 hours.  Remember to change the batteries in your smoke detectors when changing your clock times in the spring and fall.  Use your seat belt every time you are in a car, and please drive safely and not be distracted with cell phones and texting while driving.   Ms. Mclamore , Thank you for taking time to come for your Medicare Wellness Visit. I appreciate your ongoing commitment to your health goals. Please review the following  plan we discussed and let me know if I can assist you in the future.   These are the goals we discussed: Goals    None      This is a list of the screening recommended for you and due dates:  Health Maintenance  Topic Date Due  . Colon Cancer Screening  09/25/2015  . Flu Shot  10/03/2015  . Mammogram  10/04/2015  . Tetanus Vaccine  06/11/2023  . DEXA scan (bone density measurement)  Completed  . Shingles Vaccine  Completed  .  Hepatitis C: One time screening is recommended by Center for Disease Control  (CDC) for  adults born from [birth year] through 1963.   Completed  . Pneumonia vaccines  Completed    Please schedule another bone density test.  If that one is entirely normal, you may not need another. Remember to get a high dose flu shot this Fall (not 10/03/15 as stated above) Please call to get a colonoscopy scheduled.  Let us know if you need anything/referral.
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
## 2015-09-30 ENCOUNTER — Other Ambulatory Visit: Payer: Self-pay | Admitting: Family Medicine

## 2015-09-30 DIAGNOSIS — G4700 Insomnia, unspecified: Secondary | ICD-10-CM

## 2015-10-02 NOTE — Telephone Encounter (Signed)
done

## 2015-10-02 NOTE — Telephone Encounter (Signed)
Is this okay to refill? 

## 2015-10-06 DIAGNOSIS — Z1231 Encounter for screening mammogram for malignant neoplasm of breast: Secondary | ICD-10-CM | POA: Diagnosis not present

## 2015-10-06 DIAGNOSIS — Z803 Family history of malignant neoplasm of breast: Secondary | ICD-10-CM | POA: Diagnosis not present

## 2015-10-06 DIAGNOSIS — Z780 Asymptomatic menopausal state: Secondary | ICD-10-CM | POA: Diagnosis not present

## 2015-10-06 LAB — HM MAMMOGRAPHY

## 2015-10-06 LAB — HM DEXA SCAN: HM DEXA SCAN: NORMAL

## 2015-10-16 ENCOUNTER — Encounter: Payer: Self-pay | Admitting: *Deleted

## 2015-10-17 ENCOUNTER — Telehealth: Payer: Self-pay | Admitting: Nurse Practitioner

## 2015-10-17 ENCOUNTER — Encounter: Payer: Self-pay | Admitting: Nurse Practitioner

## 2015-10-17 NOTE — Telephone Encounter (Signed)
Please let pt know that BMD done on 10/06/15 shows a T Score at the right hip neck is -0.20; left hip neck -0.80; let radius -0.70.  All the measured sites fall in the normal range.  Good job on exercise, calcium, and Vit D.  Will repeat in 3-4 yrs.

## 2015-10-18 ENCOUNTER — Encounter: Payer: Self-pay | Admitting: Family Medicine

## 2015-10-19 NOTE — Telephone Encounter (Signed)
Spoke with patient about BMD. Patient verbalized agreeable and understands results. Patient to call back with any questions or concerns. -sco

## 2015-10-24 ENCOUNTER — Ambulatory Visit (INDEPENDENT_AMBULATORY_CARE_PROVIDER_SITE_OTHER): Payer: Medicare Other | Admitting: Family Medicine

## 2015-10-24 ENCOUNTER — Encounter: Payer: Self-pay | Admitting: Family Medicine

## 2015-10-24 ENCOUNTER — Telehealth: Payer: Self-pay

## 2015-10-24 VITALS — BP 120/82 | HR 68 | Temp 97.8°F | Resp 16 | Wt 145.0 lb

## 2015-10-24 DIAGNOSIS — J209 Acute bronchitis, unspecified: Secondary | ICD-10-CM

## 2015-10-24 MED ORDER — AZITHROMYCIN 500 MG PO TABS: 500.0000 mg | ORAL_TABLET | Freq: Every day | ORAL | 0 refills | Status: DC

## 2015-10-24 NOTE — Telephone Encounter (Signed)
Please fax referral for routine screening colonoscopy for screening for colon cancer. (last was 09/2005 at [facility] with Dr. Sammuel Cooper)

## 2015-10-24 NOTE — Telephone Encounter (Signed)
Pt called the office to let us know that she tried to schedule consult for colonoscopy at [facility], but they need a faxed referral to [phone]. Thank you, Wells Guiles

## 2015-10-24 NOTE — Progress Notes (Signed)
   Subjective:    Patient ID: Stephanie Harrell, female    DOB: [birth year], 70 y.o.   MRN: [id number]  HPI Approximately 3 weeks ago while she was in [place] she developed sinus congestion, PND, rhinorrhea and a productive cough as well as PND and fatigue. She has slowly gotten better but still feels that she is not quite back to normal. She is still having difficulty with cough, malaise and fatigue but no fever or chills. She does not smoke. Apparently her boyfriend had similar problems and is now being treated with antibiotics with questionable success. He was apparently just switched to azithromycin.  Review of Systems     Objective:   Physical Exam Alert and in no distress. Tympanic membranes and canals are normal. Pharyngeal area is normal. Neck is supple without adenopathy or thyromegaly. Cardiac exam shows a regular sinus rhythm without murmurs or gallops. Lungs are clear to auscultation.        Assessment & Plan:  Acute bronchitis, unspecified organism - Plan: azithromycin (ZITHROMAX) 500 MG tablet I will place her on azithromycin since she still having some residual symptoms. Explained that the medicine should last 7-10 days and if she is not back to her normal self, to call for possible refill.

## 2015-10-25 NOTE — Telephone Encounter (Signed)
Done

## 2015-11-02 ENCOUNTER — Other Ambulatory Visit: Payer: Self-pay | Admitting: Family Medicine

## 2015-11-02 DIAGNOSIS — J209 Acute bronchitis, unspecified: Secondary | ICD-10-CM

## 2015-11-02 NOTE — Telephone Encounter (Signed)
Pt states her sx have not improved and request refill of ABX. Stephanie Harrell

## 2015-11-02 NOTE — Telephone Encounter (Signed)
LM on VCM that rx refilled.

## 2015-11-02 NOTE — Telephone Encounter (Signed)
Is this okay?

## 2015-12-28 DIAGNOSIS — Z23 Encounter for immunization: Secondary | ICD-10-CM | POA: Diagnosis not present

## 2016-03-08 DIAGNOSIS — K640 First degree hemorrhoids: Secondary | ICD-10-CM | POA: Diagnosis not present

## 2016-03-08 DIAGNOSIS — D126 Benign neoplasm of colon, unspecified: Secondary | ICD-10-CM | POA: Diagnosis not present

## 2016-03-08 DIAGNOSIS — K648 Other hemorrhoids: Secondary | ICD-10-CM | POA: Diagnosis not present

## 2016-03-08 DIAGNOSIS — Z1211 Encounter for screening for malignant neoplasm of colon: Secondary | ICD-10-CM | POA: Diagnosis not present

## 2016-03-08 DIAGNOSIS — D123 Benign neoplasm of transverse colon: Secondary | ICD-10-CM | POA: Diagnosis not present

## 2016-03-08 DIAGNOSIS — D122 Benign neoplasm of ascending colon: Secondary | ICD-10-CM | POA: Diagnosis not present

## 2016-03-12 ENCOUNTER — Encounter: Payer: Self-pay | Admitting: Family Medicine

## 2016-03-12 LAB — HM COLONOSCOPY

## 2016-03-13 ENCOUNTER — Encounter: Payer: Self-pay | Admitting: *Deleted

## 2016-03-14 ENCOUNTER — Encounter: Payer: Self-pay | Admitting: Family Medicine

## 2016-05-28 ENCOUNTER — Ambulatory Visit (INDEPENDENT_AMBULATORY_CARE_PROVIDER_SITE_OTHER): Payer: Medicare Other | Admitting: Nurse Practitioner

## 2016-05-28 ENCOUNTER — Encounter: Payer: Self-pay | Admitting: Nurse Practitioner

## 2016-05-28 VITALS — BP 136/82 | HR 72 | Ht 67.5 in | Wt 149.0 lb

## 2016-05-28 DIAGNOSIS — E559 Vitamin D deficiency, unspecified: Secondary | ICD-10-CM

## 2016-05-28 DIAGNOSIS — Z800 Family history of malignant neoplasm of digestive organs: Secondary | ICD-10-CM | POA: Insufficient documentation

## 2016-05-28 DIAGNOSIS — N952 Postmenopausal atrophic vaginitis: Secondary | ICD-10-CM | POA: Diagnosis not present

## 2016-05-28 DIAGNOSIS — Z01411 Encounter for gynecological examination (general) (routine) with abnormal findings: Secondary | ICD-10-CM

## 2016-05-28 DIAGNOSIS — D126 Benign neoplasm of colon, unspecified: Secondary | ICD-10-CM | POA: Insufficient documentation

## 2016-05-28 MED ORDER — NITROFURANTOIN MONOHYD MACRO 100 MG PO CAPS: ORAL_CAPSULE | ORAL | 12 refills | Status: DC

## 2016-05-28 NOTE — Patient Instructions (Signed)

## 2016-05-28 NOTE — Progress Notes (Signed)
Patient ID: Stephanie Harrell, female   DOB: 25-Aug-1945, 71 y.o.   MRN: [id number]  70 y.o. [id number] Divorced  Caucasian Fe here for annual exam.  No new diagnosis.  OA of both knees and used Ibuprofen prn while mountain climbing in [place].   Still with same partner.  Patient's last menstrual period was 02/02/2003.          Sexually active: Yes.    The current method of family planning is post menopausal status.    Exercising: Yes.    walking and pilates. Smoker:  no  Health Maintenance: Pap: 05/23/15, Negative  09/28/12, Negative with neg HR HPV MMG: 10/06/15, 3D, Bi-Rads 1:  Negative Colonoscopy: 03/08/16, Tubular Adenoma, repeat in 3 years BMD: 10/06/15 T Score, -0.20 Right Femur Neck / -0.80 Left Femur Neck / -0.70 Left 1/3 Radius TDaP:  06/10/2013 Shingles: 08/17/13 Pneumonia: 06/10/13 Prevnar-13, 08/24/14 Pneumovax Hep C: 05/23/15 Labs: PCP takes care of labs, we follow Vit D   reports that she quit smoking about 5 years ago. She has never used smokeless tobacco. She reports that she drinks about 6.0 oz of alcohol per week . She reports that she does not use drugs.  Past Medical History:  Diagnosis Date  . Allergic rhinitis, cause unspecified    mold, mildew, and seasonal allergies  . Anxiety   . 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 Depressive disorder, not elsewhere classified   . 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 Endometriosis   . Fatigue   . FHx: breast cancer   . FHx: colon cancer   . Frequent UTI    h/o  . GERD (gastroesophageal reflux disease)   . Insomnia   . MVP (mitral valve prolapse)    h/o  . Postmenopausal   . WBC decreased 5/06   borderline (3.5-4.0)    Past Surgical History:  Procedure Laterality Date  . LAPAROSCOPY  1978   (prior to getting pregnant, for endometriosis with mini lap with wedge resection of ovary    Current Outpatient Prescriptions  Medication Sig Dispense Refill  . calcium-vitamin D (OSCAL WITH D)  500-200 MG-UNIT tablet Take 1 tablet by mouth daily.     . clobetasol (TEMOVATE) 0.05 % external solution APPLY TO AFFECTED AREAS ON SCALP 2X DAILY X 2-4 WEEKS PRN FLARES  2  . fluticasone (FLONASE) 50 MCG/ACT nasal spray USE 2 SPRAYS INTO EACH NOSTRIL EVERY DAY 16 g 11  . lansoprazole (PREVACID) 15 MG capsule Take 30 mg by mouth daily.      Marland Kitchen loratadine (CLARITIN) 10 MG tablet Take 10 mg by mouth daily.    . Multiple Vitamin (MULTIVITAMIN) tablet Take 1 tablet by mouth daily.    . Omega 3-6-9 Fatty Acids (OMEGA 3-6-9 COMPLEX PO) Take 3 capsules by mouth daily.    . pseudoephedrine (SUDAFED) 30 MG tablet Take 30 mg by mouth every 4 (four) hours as needed for congestion.    . sertraline (ZOLOFT) 50 MG tablet Take 1-2 tablets daily at bedtime as directed (Patient taking differently: Take 50 mg by mouth at bedtime. ) 180 tablet 3  . traZODone (DESYREL) 50 MG tablet TAKE 1/2 TABLET BY MOUTH AT BEDTIME. YOU MAY TAKE A FULL TABLET AT BEDTIME IF NEEDED. 90 tablet 3  . tretinoin (RETIN-A) 0.025 % cream Apply thin layer to face once nightly as tolerated    . nitrofurantoin, macrocrystal-monohydrate, (MACROBID) 100 MG capsule 1 capsule post coital prn  30 capsule 12   No current facility-administered medications for this visit.     Family History  Problem Relation Age of Onset  . Hypertension Mother   . Cancer Mother     uterine cancer @ 55; Breast cancer at 79 and 81's  . Heart disease Mother     aortic stenosis; pacemaker  . Uterine cancer Mother   . Breast cancer Mother   . Heart disease Father   . Cancer Father     pancreatic  . Depression Father   . Depression Sister   . Depression Brother   . Heart failure Brother   . Cancer Paternal Aunt 46    breast  . Breast cancer Paternal Aunt   . Cancer Maternal Grandmother 35    colon cancer  . Colon cancer Maternal Grandmother     ROS:  Pertinent items are noted in HPI.  Otherwise, a comprehensive ROS was negative.  Exam:   BP 136/82 (BP  Location: Right Arm, Patient Position: Sitting, Cuff Size: Normal)   Pulse 72   Ht 5' 7.5" (1.715 m)   Wt 149 lb (67.6 kg)   LMP 02/02/2003   BMI 22.99 kg/m  Height: 5' 7.5" (171.5 cm) Ht Readings from Last 3 Encounters:  05/28/16 5' 7.5" (1.715 m)  08/24/15 5' 7.5" (1.715 m)  05/23/15 5\' 8"  (1.727 m)    General appearance: alert, cooperative and appears stated age Head: Normocephalic, without obvious abnormality, atraumatic Neck: no adenopathy, supple, symmetrical, trachea midline and thyroid normal to inspection and palpation Lungs: clear to auscultation bilaterally Breasts: normal appearance, no masses or tenderness Heart: regular rate and rhythm Abdomen: soft, non-tender; no masses,  no organomegaly Extremities: extremities normal, atraumatic, no cyanosis or edema Skin: Skin color, texture, turgor normal. No rashes or lesions Lymph nodes: Cervical, supraclavicular, and axillary nodes normal. No abnormal inguinal nodes palpated Neurologic: Grossly normal   Pelvic: External genitalia:  no lesions              Urethra:  normal appearing urethra with no masses, tenderness or lesions              Bartholin's and Skene's: normal                 Vagina: atrophic appearing vagina with normal color and discharge, no lesions              Cervix: anteverted              Pap taken: No. Bimanual Exam:  Uterus:  normal size, contour, position, consistency, mobility, non-tender              Adnexa: no mass, fullness, tenderness               Rectovaginal: Confirms               Anus:  normal sphincter tone, no lesions  Chaperone present: yes  A:  Well Woman with normal exam  Postmenopausal no HRT [place] + colon and breast cancer history of ASCUS with negative HR HPV 2010             History of OA             History of Vit D deficiency  Atrophic vaginitis  P:   Reviewed health and wellness pertinent to exam  Pap smear not done  Mammogram is due  10/2016  Will follow with Vit D  Counseled on breast self exam, mammography screening, adequate  intake of calcium and vitamin D, diet and exercise, Kegel's exercises return annually or prn  An After Visit Summary was printed and given to the patient.

## 2016-05-29 LAB — VITAMIN D 25 HYDROXY (VIT D DEFICIENCY, FRACTURES): VIT D 25 HYDROXY: 47 ng/mL (ref 30–100)

## 2016-05-31 IMAGING — CR DG KNEE COMPLETE 4+V*L*
1 series · 1 of 1 positions shown · non-contrast
Comparison: None in PACs

CLINICAL DATA: Bilateral knee pain the past several weeks without
known injury, symptoms centered in the patellar any old posterior
aspects of the knees. Patient reports some swelling of the right
knee.

EXAM:
LEFT KNEE - COMPLETE 4+ VIEW; RIGHT KNEE - COMPLETE 4+ VIEW

[view not recorded]
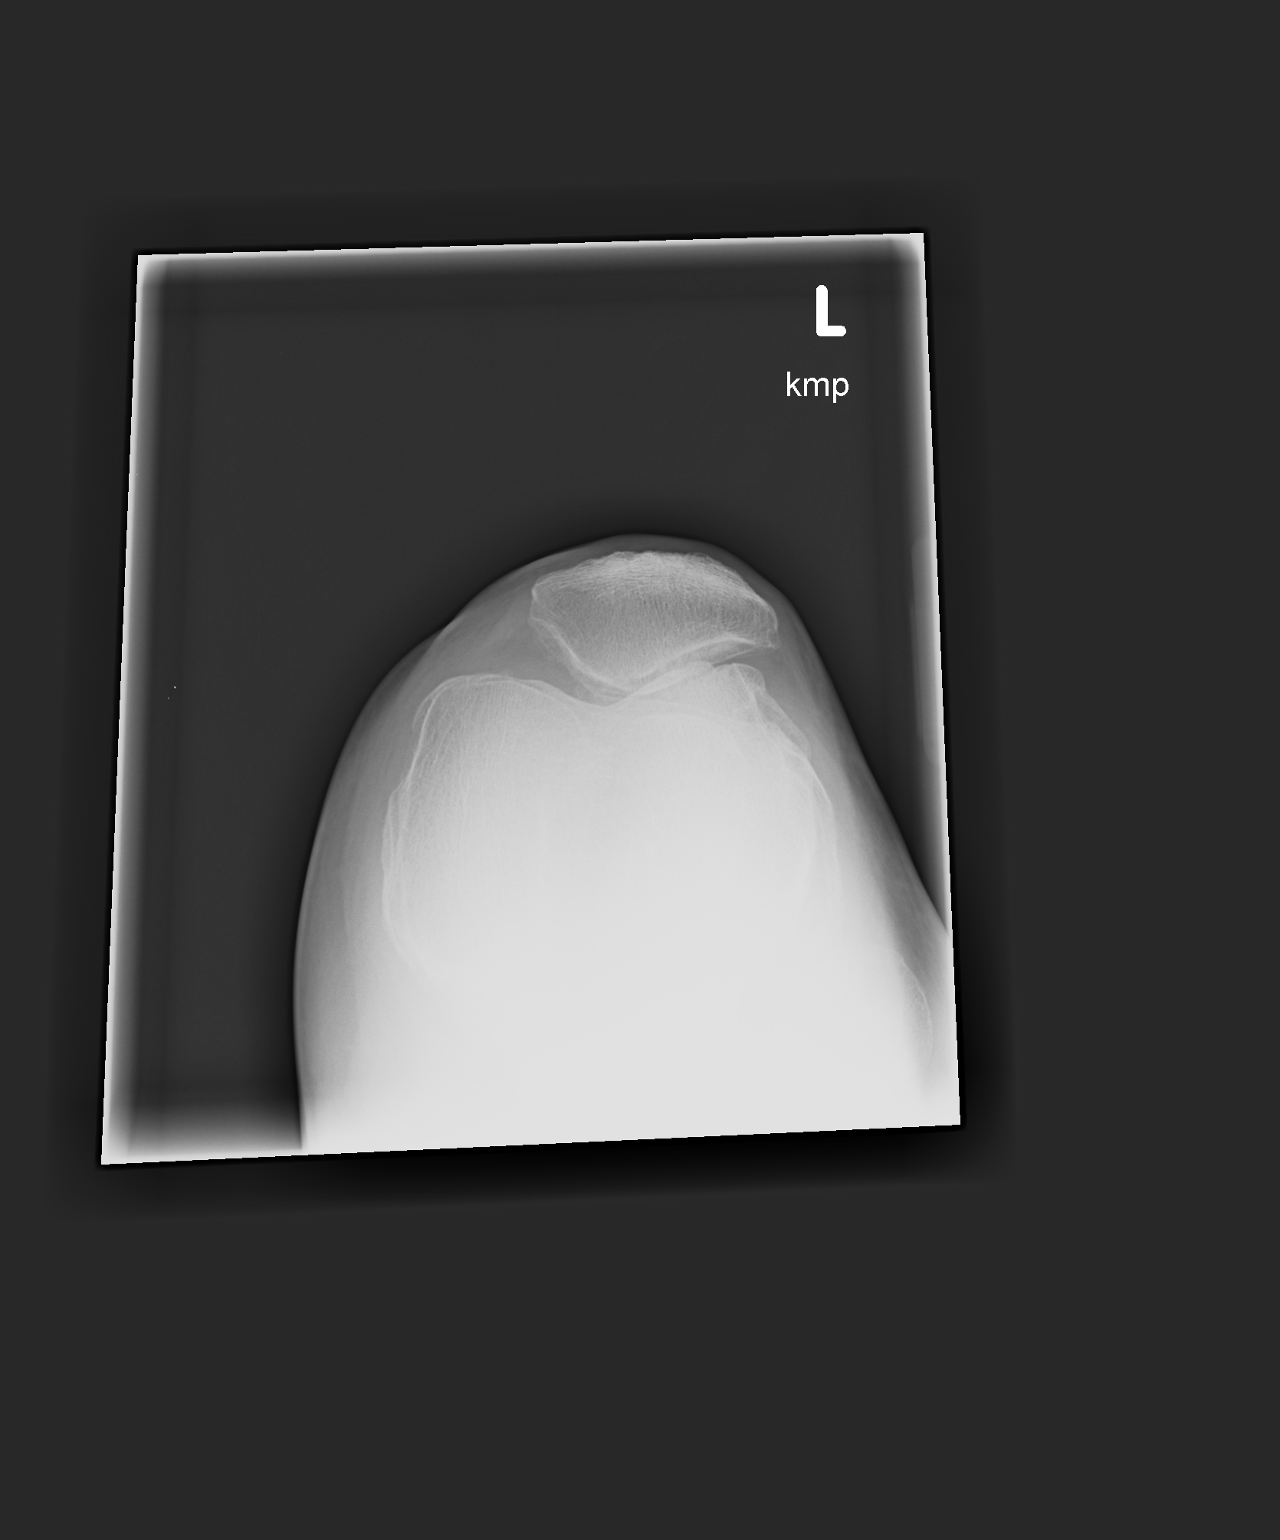

[1 of 1 positions shown; findings below may reference images not displayed]

FINDINGS: Right knee: The bones are adequately mineralized. There is mild
narrowing of the medial joint compartment. There is beaking of the
tibial spines. The tibial plateaus are intact. The proximal fibula
is normal. There tiny spurs arising from the superior and inferior
articular margins of the patella. A small suprapatellar effusion is
suspected.

Left knee: The bones of the left knee are adequately mineralized.
The joint spaces are reasonably well-maintained. There is beaking of
the tibial spines and there are spurs arising from the superior and
inferior articular margins of the patella. The patella appears
slightly laterally positioned with respect to the femoral condyles
on the sunrise view. There is no joint effusion. The tibial plateaus
are intact.
IMPRESSION: 1. There is a small joint effusion on the right. There is mild
narrowing of the medial and patellofemoral compartments consistent
with osteoarthritis.
2. There is mild narrowing of the low left patellofemoral joint
compartment especially laterally with slight lateral subluxation of
the patella with respect to the femoral condyles. The medial and
lateral joint spaces are preserved but there is beaking of the
tibial spines. There is a no joint effusion. The findings are
consistent with osteoarthritis.

## 2016-06-01 NOTE — Progress Notes (Signed)
Reviewed personally.  M. Clarinda Athena Baltz, MD.  

## 2016-08-22 ENCOUNTER — Other Ambulatory Visit: Payer: Self-pay | Admitting: Family Medicine

## 2016-08-22 DIAGNOSIS — F325 Major depressive disorder, single episode, in full remission: Secondary | ICD-10-CM
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
## 2016-08-29 ENCOUNTER — Telehealth: Payer: Self-pay

## 2016-08-29 ENCOUNTER — Ambulatory Visit (INDEPENDENT_AMBULATORY_CARE_PROVIDER_SITE_OTHER): Payer: Medicare Other | Admitting: Family Medicine

## 2016-08-29 ENCOUNTER — Encounter: Payer: Self-pay | Admitting: Family Medicine

## 2016-08-29 VITALS — BP 118/72 | HR 60 | Ht 67.5 in | Wt 141.0 lb

## 2016-08-29 DIAGNOSIS — F325 Major depressive disorder, single episode, in full remission: Secondary | ICD-10-CM | POA: Diagnosis not present

## 2016-08-29 DIAGNOSIS — E559 Vitamin D deficiency, unspecified: Secondary | ICD-10-CM

## 2016-08-29 DIAGNOSIS — Z5181 Encounter for therapeutic drug level monitoring: Secondary | ICD-10-CM

## 2016-08-29 DIAGNOSIS — Z0000 Encounter for general adult medical examination without abnormal findings: Secondary | ICD-10-CM

## 2016-08-29 DIAGNOSIS — Z8249 Family history of ischemic heart disease and other diseases of the circulatory system: Secondary | ICD-10-CM

## 2016-08-29 DIAGNOSIS — K219 Gastro-esophageal reflux disease without esophagitis: Secondary | ICD-10-CM

## 2016-08-29 DIAGNOSIS — D126 Benign neoplasm of colon, unspecified: Secondary | ICD-10-CM | POA: Diagnosis not present

## 2016-08-29 DIAGNOSIS — N390 Urinary tract infection, site not specified: Secondary | ICD-10-CM

## 2016-08-29 DIAGNOSIS — J309 Allergic rhinitis, unspecified: Secondary | ICD-10-CM

## 2016-08-29 DIAGNOSIS — G4700 Insomnia, unspecified: Secondary | ICD-10-CM

## 2016-08-29 DIAGNOSIS — Z13220 Encounter for screening for lipoid disorders: Secondary | ICD-10-CM | POA: Diagnosis not present

## 2016-08-29 LAB — CBC WITH DIFFERENTIAL/PLATELET
BASOS PCT: 0 %
Basophils Absolute: 0 cells/uL (ref 0–200)
EOS ABS: 42 {cells}/uL (ref 15–500)
Eosinophils Relative: 1 %
HEMATOCRIT: 44.2 % (ref 35.0–45.0)
Hemoglobin: 14.8 g/dL (ref 11.7–15.5)
LYMPHS ABS: 1260 {cells}/uL (ref 850–3900)
Lymphocytes Relative: 30 %
MCH: 31.9 pg (ref 27.0–33.0)
MCHC: 33.5 g/dL (ref 32.0–36.0)
MCV: 95.3 fL (ref 80.0–100.0)
MONO ABS: 294 {cells}/uL (ref 200–950)
MPV: 9.6 fL (ref 7.5–12.5)
Monocytes Relative: 7 %
Neutro Abs: 2604 cells/uL (ref 1500–7800)
Neutrophils Relative %: 62 %
Platelets: 240 10*3/uL (ref 140–400)
RBC: 4.64 MIL/uL (ref 3.80–5.10)
RDW: 12.8 % (ref 11.0–15.0)
WBC: 4.2 10*3/uL (ref 4.0–10.5)

## 2016-08-29 LAB — TSH: TSH: 0.34 mIU/L — ABNORMAL LOW

## 2016-08-29 MED ORDER — SERTRALINE HCL 50 MG PO TABS: ORAL_TABLET | ORAL | 3 refills | Status: DC

## 2016-08-29 MED ORDER — NITROFURANTOIN MONOHYD MACRO 100 MG PO CAPS: ORAL_CAPSULE | ORAL | 11 refills | Status: DC

## 2016-08-29 MED ORDER — FLUTICASONE PROPIONATE 50 MCG/ACT NA SUSP: NASAL | 11 refills | Status: DC

## 2016-08-29 MED ORDER — TRAZODONE HCL 50 MG PO TABS: ORAL_TABLET | ORAL | 3 refills | Status: DC

## 2016-08-29 NOTE — Patient Instructions (Signed)
  HEALTH MAINTENANCE RECOMMENDATIONS:  It is recommended that you get at least 30 minutes of aerobic exercise at least 5 days/week (for weight loss, you may need as much as 60-90 minutes). This can be any activity that gets your heart rate up. This can be divided in 10-15 minute intervals if needed, but try and build up your endurance at least once a week.  Weight bearing exercise is also recommended twice weekly.  Eat a healthy diet with lots of vegetables, fruits and fiber.  "Colorful" foods have a lot of vitamins (ie green vegetables, tomatoes, red peppers, etc).  Limit sweet tea, regular sodas and alcoholic beverages, all of which has a lot of calories and sugar.  Up to 1 alcoholic drink daily may be beneficial for women (unless trying to lose weight, watch sugars).  Drink a lot of water.  Calcium recommendations are 1200-1500 mg daily (1500 mg for postmenopausal women or women without ovaries), and vitamin D 1000 IU daily.  This should be obtained from diet and/or supplements (vitamins), and calcium should not be taken all at once, but in divided doses.  Monthly self breast exams and yearly mammograms for women over the age of 38 is recommended.  Sunscreen of at least SPF 30 should be used on all sun-exposed parts of the skin when outside between the hours of 10 am and 4 pm (not just when at beach or pool, but even with exercise, golf, tennis, and yard work!)  Use a sunscreen that says "broad spectrum" so it covers both UVA and UVB rays, and make sure to reapply every 1-2 hours.  Remember to change the batteries in your smoke detectors when changing your clock times in the spring and fall.  Use your seat belt every time you are in a car, and please drive safely and not be distracted with cell phones and texting while driving.   Ms. Gilcrease , Thank you for taking time to come for your Medicare Wellness Visit. I appreciate your ongoing commitment to your health goals. Please review the following  plan we discussed and let me know if I can assist you in the future.   These are the goals we discussed: Goals    None      This is a list of the screening recommended for you and due dates:  Health Maintenance  Topic Date Due  . Flu Shot  10/02/2016  . Mammogram  10/05/2016  . Colon Cancer Screening  03/13/2019  . Tetanus Vaccine  06/11/2023  . DEXA scan (bone density measurement)  Completed  .  Hepatitis C: One time screening is recommended by Center for Disease Control  (CDC) for  adults born from [birth year] through 1963.   Completed  . Pneumonia vaccines  Completed   I recommend getting the new shingles vaccine (Shingrix). You will need to check with your insurance to see if it is covered, and if covered by Medicare Part D, you need to get from the pharmacy rather than our office.  It is a series of 2 injections, spaced 2 months apart.

## 2016-08-29 NOTE — Telephone Encounter (Signed)
These refills were already done while patient here at her visit

## 2016-08-29 NOTE — Telephone Encounter (Signed)
Fax request rcvd after OV today asking for [facility]ills [facility] [place] [facility].Stephanie Harrell

## 2016-08-30 ENCOUNTER — Encounter: Payer: Self-pay | Admitting: Family Medicine

## 2016-08-30 LAB — COMPREHENSIVE METABOLIC PANEL
ALT: 17 U/L (ref 6–29)
AST: 26 U/L (ref 10–35)
Albumin: 4.9 g/dL (ref 3.6–5.1)
Alkaline Phosphatase: 75 U/L (ref 33–130)
BUN: 12 mg/dL (ref 7–25)
CHLORIDE: 99 mmol/L (ref 98–110)
CO2: 27 mmol/L (ref 20–31)
CREATININE: 0.73 mg/dL (ref 0.50–0.99)
Calcium: 9.9 mg/dL (ref 8.6–10.4)
GLUCOSE: 98 mg/dL (ref 65–99)
POTASSIUM: 4.4 mmol/L (ref 3.5–5.3)
SODIUM: 138 mmol/L (ref 135–146)
Total Bilirubin: 0.8 mg/dL (ref 0.2–1.2)
Total Protein: 7.2 g/dL (ref 6.1–8.1)

## 2016-08-30 LAB — LIPID PANEL
CHOL/HDL RATIO: 2 ratio (ref <5.0)
Cholesterol: 214 mg/dL — ABNORMAL HIGH (ref <200)
HDL: 106 mg/dL (ref >50)
LDL CALC: 91 mg/dL (ref <100)
Triglycerides: 85 mg/dL (ref <150)
VLDL: 17 mg/dL (ref <30)

## 2016-10-28 DIAGNOSIS — M542 Cervicalgia: Secondary | ICD-10-CM | POA: Diagnosis not present

## 2016-10-28 DIAGNOSIS — M545 Low back pain: Secondary | ICD-10-CM | POA: Diagnosis not present

## 2016-10-28 DIAGNOSIS — G8929 Other chronic pain: Secondary | ICD-10-CM | POA: Diagnosis not present

## 2016-10-28 DIAGNOSIS — M419 Scoliosis, unspecified: Secondary | ICD-10-CM | POA: Diagnosis not present

## 2016-10-28 DIAGNOSIS — M5136 Other intervertebral disc degeneration, lumbar region: Secondary | ICD-10-CM | POA: Diagnosis not present

## 2016-10-28 DIAGNOSIS — M47812 Spondylosis without myelopathy or radiculopathy, cervical region: Secondary | ICD-10-CM | POA: Diagnosis not present

## 2016-11-15 DIAGNOSIS — M419 Scoliosis, unspecified: Secondary | ICD-10-CM | POA: Diagnosis not present

## 2016-11-15 DIAGNOSIS — M5489 Other dorsalgia: Secondary | ICD-10-CM | POA: Diagnosis not present

## 2016-11-15 DIAGNOSIS — R293 Abnormal posture: Secondary | ICD-10-CM | POA: Diagnosis not present

## 2016-11-15 DIAGNOSIS — M542 Cervicalgia: Secondary | ICD-10-CM | POA: Diagnosis not present

## 2016-11-21 DIAGNOSIS — H31002 Unspecified chorioretinal scars, left eye: Secondary | ICD-10-CM | POA: Diagnosis not present

## 2016-11-21 DIAGNOSIS — H5213 Myopia, bilateral: Secondary | ICD-10-CM | POA: Diagnosis not present

## 2016-12-03 DIAGNOSIS — Z23 Encounter for immunization: Secondary | ICD-10-CM | POA: Diagnosis not present

## 2016-12-10 DIAGNOSIS — M5489 Other dorsalgia: Secondary | ICD-10-CM | POA: Diagnosis not present

## 2016-12-10 DIAGNOSIS — M542 Cervicalgia: Secondary | ICD-10-CM | POA: Diagnosis not present

## 2016-12-10 DIAGNOSIS — R293 Abnormal posture: Secondary | ICD-10-CM | POA: Diagnosis not present

## 2016-12-10 DIAGNOSIS — M419 Scoliosis, unspecified: Secondary | ICD-10-CM | POA: Diagnosis not present

## 2016-12-30 DIAGNOSIS — M5489 Other dorsalgia: Secondary | ICD-10-CM | POA: Diagnosis not present

## 2016-12-30 DIAGNOSIS — M419 Scoliosis, unspecified: Secondary | ICD-10-CM | POA: Diagnosis not present

## 2016-12-30 DIAGNOSIS — R293 Abnormal posture: Secondary | ICD-10-CM | POA: Diagnosis not present

## 2016-12-30 DIAGNOSIS — M542 Cervicalgia: Secondary | ICD-10-CM | POA: Diagnosis not present

## 2017-03-31 DIAGNOSIS — M419 Scoliosis, unspecified: Secondary | ICD-10-CM | POA: Diagnosis not present

## 2017-03-31 DIAGNOSIS — M5489 Other dorsalgia: Secondary | ICD-10-CM | POA: Diagnosis not present

## 2017-03-31 DIAGNOSIS — M542 Cervicalgia: Secondary | ICD-10-CM | POA: Diagnosis not present

## 2017-03-31 DIAGNOSIS — R293 Abnormal posture: Secondary | ICD-10-CM | POA: Diagnosis not present

## 2017-06-23 DIAGNOSIS — Z803 Family history of malignant neoplasm of breast: Secondary | ICD-10-CM | POA: Diagnosis not present

## 2017-06-23 DIAGNOSIS — Z1231 Encounter for screening mammogram for malignant neoplasm of breast: Secondary | ICD-10-CM | POA: Diagnosis not present

## 2017-06-23 LAB — HM MAMMOGRAPHY

## 2017-06-23 LAB — HM COLONOSCOPY

## 2017-08-12 ENCOUNTER — Other Ambulatory Visit: Payer: Self-pay | Admitting: Family Medicine

## 2017-08-12 DIAGNOSIS — J309 Allergic rhinitis, unspecified: Secondary | ICD-10-CM

## 2017-08-12 NOTE — Telephone Encounter (Signed)
Pt has an appt coming in July. Will refill for 30 days

## 2017-08-13 ENCOUNTER — Other Ambulatory Visit: Payer: Self-pay | Admitting: Family Medicine

## 2017-08-13 DIAGNOSIS — F325 Major depressive disorder, single episode, in full remission: Secondary | ICD-10-CM

## 2017-08-13 NOTE — Telephone Encounter (Signed)
Has appt 7/11.

## 2017-08-13 NOTE — Telephone Encounter (Signed)
Is this ok to refill?  

## 2017-09-08 ENCOUNTER — Encounter: Payer: Self-pay | Admitting: Family Medicine

## 2017-09-10 ENCOUNTER — Other Ambulatory Visit: Payer: Self-pay | Admitting: Family Medicine

## 2017-09-10 DIAGNOSIS — J309 Allergic rhinitis, unspecified: Secondary | ICD-10-CM

## 2017-09-10 MED ORDER — FLUTICASONE PROPIONATE 50 MCG/ACT NA SUSP: NASAL | 0 refills | Status: DC
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
## 2017-09-11 ENCOUNTER — Encounter: Payer: Self-pay | Admitting: Family Medicine

## 2017-09-11 ENCOUNTER — Other Ambulatory Visit: Payer: Self-pay | Admitting: Family Medicine

## 2017-09-11 ENCOUNTER — Ambulatory Visit (INDEPENDENT_AMBULATORY_CARE_PROVIDER_SITE_OTHER): Payer: Medicare Other | Admitting: Family Medicine

## 2017-09-11 VITALS — BP 124/72 | HR 68 | Ht 67.75 in | Wt 139.6 lb

## 2017-09-11 DIAGNOSIS — J309 Allergic rhinitis, unspecified: Secondary | ICD-10-CM | POA: Diagnosis not present

## 2017-09-11 DIAGNOSIS — L219 Seborrheic dermatitis, unspecified: Secondary | ICD-10-CM

## 2017-09-11 DIAGNOSIS — N390 Urinary tract infection, site not specified: Secondary | ICD-10-CM | POA: Diagnosis not present

## 2017-09-11 DIAGNOSIS — Z5181 Encounter for therapeutic drug level monitoring: Secondary | ICD-10-CM | POA: Diagnosis not present

## 2017-09-11 DIAGNOSIS — G4700 Insomnia, unspecified: Secondary | ICD-10-CM

## 2017-09-11 DIAGNOSIS — R7989 Other specified abnormal findings of blood chemistry: Secondary | ICD-10-CM | POA: Diagnosis not present

## 2017-09-11 DIAGNOSIS — R634 Abnormal weight loss: Secondary | ICD-10-CM | POA: Diagnosis not present

## 2017-09-11 DIAGNOSIS — Z0000 Encounter for general adult medical examination without abnormal findings: Secondary | ICD-10-CM | POA: Diagnosis not present

## 2017-09-11 DIAGNOSIS — F325 Major depressive disorder, single episode, in full remission: Secondary | ICD-10-CM | POA: Diagnosis not present

## 2017-09-11 MED ORDER — CLOBETASOL PROPIONATE 0.05 % EX SOLN: CUTANEOUS | 2 refills | Status: DC

## 2017-09-11 MED ORDER — FLUTICASONE PROPIONATE 50 MCG/ACT NA SUSP: NASAL | 3 refills | Status: DC

## 2017-09-11 MED ORDER — TRAZODONE HCL 50 MG PO TABS
ORAL_TABLET | ORAL | 3 refills | Status: DC
Start: 2017-09-11 — End: 2018-10-12

## 2017-09-11 NOTE — Telephone Encounter (Signed)
Is this okay to change? Insurance will no longer pay for clobetasol. Is it okay to change to the triamcinolone solution?

## 2017-09-11 NOTE — Telephone Encounter (Signed)
Patient advised.

## 2017-09-11 NOTE — Telephone Encounter (Signed)
Advise pt that the clobetasol is no longer covered.  We can change to a different topcial steroid (triamcinolone).  If this doesn't work as well, let us know and we can try and get prior auth.  Okay to change rx if pt agrees

## 2017-09-11 NOTE — Patient Instructions (Addendum)
  HEALTH MAINTENANCE RECOMMENDATIONS:  It is recommended that you get at least 30 minutes of aerobic exercise at least 5 days/week (for weight loss, you may need as much as 60-90 minutes). This can be any activity that gets your heart rate up. This can be divided in 10-15 minute intervals if needed, but try and build up your endurance at least once a week.  Weight bearing exercise is also recommended twice weekly.  Eat a healthy diet with lots of vegetables, fruits and fiber.  "Colorful" foods have a lot of vitamins (ie green vegetables, tomatoes, red peppers, etc).  Limit sweet tea, regular sodas and alcoholic beverages, all of which has a lot of calories and sugar.  Up to 1 alcoholic drink daily may be beneficial for women (unless trying to lose weight, watch sugars).  Drink a lot of water.  Calcium recommendations are 1200-1500 mg daily (1500 mg for postmenopausal women or women without ovaries), and vitamin D 1000 IU daily.  This should be obtained from diet and/or supplements (vitamins), and calcium should not be taken all at once, but in divided doses.  Monthly self breast exams and yearly mammograms for women over the age of 38 is recommended.  Sunscreen of at least SPF 30 should be used on all sun-exposed parts of the skin when outside between the hours of 10 am and 4 pm (not just when at beach or pool, but even with exercise, golf, tennis, and yard work!)  Use a sunscreen that says "broad spectrum" so it covers both UVA and UVB rays, and make sure to reapply every 1-2 hours.  Remember to change the batteries in your smoke detectors when changing your clock times in the spring and fall.   Use your seat belt every time you are in a car, and please drive safely and not be distracted with cell phones and texting while driving.   Stephanie Harrell , Thank you for taking time to come for your Medicare Wellness Visit. I appreciate your ongoing commitment to your health goals. Please review the  following plan we discussed and let me know if I can assist you in the future.   These are the goals we discussed: Goals    None      This is a list of the screening recommended for you and due dates:  Health Maintenance  Topic Date Due  . Flu Shot  10/02/2017  . Mammogram  06/24/2018  . Colon Cancer Screening  06/23/2020  . Tetanus Vaccine  06/11/2023  . DEXA scan (bone density measurement)  Completed  .  Hepatitis C: One time screening is recommended by Center for Disease Control  (CDC) for  adults born from [birth year] through 1963.   Completed  . Pneumonia vaccines  Completed   Continue flu shots in the Fall Your next colonoscopy due date is 03/2019 (not the date above).  Next Shingrix is due in 2 months, since you had the first one yesterday.  Please return (and discuss with your family) the living will and healthcare power of attorney.  I'm refilling your nitrofurantoin--since you are not having frequent infections, use it only if needed for symptoms of a bladder infection.  Use it twice daily for a full 5 days.  Schedule your Well Woman exam with your GYN

## 2017-09-12 LAB — TSH: TSH: 0.436 u[IU]/mL — ABNORMAL LOW (ref 0.450–4.500)

## 2017-09-30 ENCOUNTER — Ambulatory Visit (INDEPENDENT_AMBULATORY_CARE_PROVIDER_SITE_OTHER): Payer: Medicare Other | Admitting: Certified Nurse Midwife

## 2017-09-30 ENCOUNTER — Encounter: Payer: Self-pay | Admitting: Certified Nurse Midwife

## 2017-09-30 VITALS — BP 128/68 | HR 64 | Resp 14 | Ht 67.75 in | Wt 142.6 lb

## 2017-09-30 DIAGNOSIS — Z803 Family history of malignant neoplasm of breast: Secondary | ICD-10-CM | POA: Diagnosis not present

## 2017-09-30 DIAGNOSIS — Z01419 Encounter for gynecological examination (general) (routine) without abnormal findings: Secondary | ICD-10-CM

## 2017-09-30 DIAGNOSIS — N952 Postmenopausal atrophic vaginitis: Secondary | ICD-10-CM | POA: Diagnosis not present

## 2017-09-30 DIAGNOSIS — Z124 Encounter for screening for malignant neoplasm of cervix: Secondary | ICD-10-CM

## 2017-09-30 DIAGNOSIS — Z8659 Personal history of other mental and behavioral disorders: Secondary | ICD-10-CM

## 2017-09-30 NOTE — Patient Instructions (Addendum)
EXERCISE AND DIET:  We recommended that you start or continue a regular exercise program for good health. Regular exercise means any activity that makes your heart beat faster and makes you sweat.  We recommend exercising at least 30 minutes per day at least 3 days a week, preferably 4 or 5.  We also recommend a diet low in fat and sugar.  Inactivity, poor dietary choices and obesity can cause diabetes, heart attack, stroke, and kidney damage, among others.    ALCOHOL AND SMOKING:  Women should limit their alcohol intake to no more than 7 drinks/beers/glasses of wine (combined, not each!) per week. Moderation of alcohol intake to this level decreases your risk of breast cancer and liver damage. And of course, no recreational drugs are part of a healthy lifestyle.  And absolutely no smoking or even second hand smoke. Most people know smoking can cause heart and lung diseases, but did you know it also contributes to weakening of your bones? Aging of your skin?  Yellowing of your teeth and nails?  CALCIUM AND VITAMIN D:  Adequate intake of calcium and Vitamin D are recommended.  The recommendations for exact amounts of these supplements seem to change often, but generally speaking 600 mg of calcium (either carbonate or citrate) and 800 units of Vitamin D per day seems prudent. Certain women may benefit from higher intake of Vitamin D.  If you are among these women, your doctor will have told you during your visit.    PAP SMEARS:  Pap smears, to check for cervical cancer or precancers,  have traditionally been done yearly, although recent scientific advances have shown that most women can have pap smears less often.  However, every woman still should have a physical exam from her gynecologist every year. It will include a breast check, inspection of the vulva and vagina to check for abnormal growths or skin changes, a visual exam of the cervix, and then an exam to evaluate the size and shape of the uterus and  ovaries.  And after 70 years of age, a rectal exam is indicated to check for rectal cancers. We will also provide age appropriate advice regarding health maintenance, like when you should have certain vaccines, screening for sexually transmitted diseases, bone density testing, colonoscopy, mammograms, etc.   MAMMOGRAMS:  All women over 40 years old should have a yearly mammogram. Many facilities now offer a "3D" mammogram, which may cost around $50 extra out of pocket. If possible,  we recommend you accept the option to have the 3D mammogram performed.  It both reduces the number of women who will be called back for extra views which then turn out to be normal, and it is better than the routine mammogram at detecting truly abnormal areas.    COLONOSCOPY:  Colonoscopy to screen for colon cancer is recommended for all women at age 50.  We know, you hate the idea of the prep.  We agree, BUT, having colon cancer and not knowing it is worse!!  Colon cancer so often starts as a polyp that can be seen and removed at colonscopy, which can quite literally save your life!  And if your first colonoscopy is normal and you have no family history of colon cancer, most women don't have to have it again for 10 years.  Once every ten years, you can do something that may end up saving your life, right?  We will be happy to help you get it scheduled when you are ready.    Be sure to check your insurance coverage so you understand how much it will cost.  It may be covered as a preventative service at no cost, but you should check your particular policy.    Organic coconut oil for dryness  Have a great day! It was nice to meet you today. Debbi  Atrophic Vaginitis Atrophic vaginitis is when the tissues that line the vagina become dry and thin. This is caused by a drop in estrogen. Estrogen helps:  To keep the vagina moist.  To make a clear fluid that helps: ? To lubricate the vagina for sex. ? To protect the vagina from  infection.  If the lining of the vagina is dry and thin, it may:  Make sex painful. It may also cause bleeding.  Cause a feeling of: ? Burning. ? Irritation. ? Itchiness.  Make an exam of your vagina painful. It may also cause bleeding.  Make you lose interest in sex.  Cause a burning feeling when you pee.  Make your vaginal fluid (discharge) brown or yellow.  For some women, there are no symptoms. This condition is most common in women who do not get their regular menstrual periods anymore (menopause). This often starts when a woman is 47-65 years old. Follow these instructions at home:  Take medicines only as told by your doctor. Do not use any herbal or alternative medicines unless your doctor says it is okay.  Use over-the-counter products for dryness only as told by your doctor. These include: ? Creams. ? Lubricants. ? Moisturizers.  Do not douche.  Do not use products that can make your vagina dry. These include: ? Scented feminine sprays. ? Scented tampons. ? Scented soaps.  If it hurts to have sex, tell your sexual partner. Contact a doctor if:  Your discharge looks different than normal.  Your vagina has an unusual smell.  You have new symptoms.  Your symptoms do not get better with treatment.  Your symptoms get worse. This information is not intended to replace advice given to you by your health care provider. Make sure you discuss any questions you have with your health care provider. Document Released: 08/07/2007 Document Revised: 07/27/2015 Document Reviewed: 02/09/2014 Elsevier Interactive Patient Education  [employer].

## 2017-09-30 NOTE — Progress Notes (Signed)
72 y.o. [id number] Divorced  Caucasian Fe here for annual exam. Menopausal no HRT. Denies vaginal bleeding. Some vaginal dryness with some external itching x 1 month off and on. Swims 3 times a week and sometimes stays in swimsuit for longer periods of time.. No new personal products or increase in discharge. Denies urinary symptoms. Has Rx Macrobid per PCP to use if needed. Working on staying healthy and has lost 9 pounds over the past year. See PCP Rita Ohara yearly and TSH was borderline, will have recheck again there. Medications for depression and insomnia managed by Dr. Larence Penning stable per patient. Exercises at least 3 x weekly and feels this is helpful. No health issues today.   Patient's last menstrual period was 02/02/2003.          Sexually active: Yes.    The current method of family planning is post menopausal status.    Exercising: Yes.    Swim and Pilates  Smoker:  no  Health Maintenance: Pap:  05-23-15 neg History of Abnormal Pap: no MMG:  06-23-17 category c density birads 2:neg Self Breast exams: yes Colonoscopy:  2018 tubular adenoma, f/u 63yrs BMD:   2017 normal TDaP:  2015 Shingles: 2015 Pneumonia: 2015 x 2 Hep C and HIV: hep c neg 2017 Labs: if needed   reports that she quit smoking about 6 years ago. She has never used smokeless tobacco. She reports that she drinks about 6.0 oz of alcohol per week. She reports that she does not use drugs.  Past Medical History:  Diagnosis Date  . Allergic rhinitis, cause unspecified    mold, mildew, and seasonal allergies  . Anxiety   . 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 Depressive disorder, not elsewhere classified   . 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 Endometriosis   . Fatigue   . FHx: breast cancer   . FHx: colon cancer   . Frequent UTI    h/o  . GERD (gastroesophageal reflux disease)   . Insomnia   . MVP (mitral valve prolapse)    h/o  . Postmenopausal   . WBC decreased 5/06    borderline (3.5-4.0)    Past Surgical History:  Procedure Laterality Date  . LAPAROSCOPY  1978   (prior to getting pregnant, for endometriosis with mini lap with wedge resection of ovary    Current Outpatient Medications  Medication Sig Dispense Refill  . calcium-vitamin D (OSCAL WITH D) 500-200 MG-UNIT tablet Take 1 tablet by mouth 2 (two) times daily.     . fluticasone (FLONASE) 50 MCG/ACT nasal spray Use 2 sprays into each nostril every day 48 g 3  . lansoprazole (PREVACID) 15 MG capsule Take 30 mg by mouth daily.      Marland Kitchen loratadine (CLARITIN) 10 MG tablet Take 10 mg by mouth daily.    . Multiple Vitamin (MULTIVITAMIN) tablet Take 1 tablet by mouth daily.    . nitrofurantoin, macrocrystal-monohydrate, (MACROBID) 100 MG capsule 1 capsule post coital prn (Patient not taking: Reported on 09/11/2017) 30 capsule 11  . NON FORMULARY Take 3 capsules by mouth daily.    . Omega 3-6-9 Fatty Acids (OMEGA 3-6-9 COMPLEX PO) Take 3 capsules by mouth daily.    . pseudoephedrine (SUDAFED) 30 MG tablet Take 30 mg by mouth every 4 (four) hours as needed for congestion.    . sertraline (ZOLOFT) 50 MG tablet TAKE 1 TO 2 TABLETS BY MOUTH DAILY AT BEDTIME AS  DIRECTED 180 tablet 0  . traZODone (DESYREL) 50 MG tablet TAKE 1/2 TABLET BY MOUTH AT BEDTIME. YOU MAY TAKE A FULL TABLET AT BEDTIME IF NEEDED. 90 tablet 3  . triamcinolone lotion (KENALOG) 0.1 % Please specify directions, refills and quantity 60 mL 0   No current facility-administered medications for this visit.     Family History  Problem Relation Age of Onset  . Hypertension Mother   . Cancer Mother        uterine cancer @ 9; Breast cancer at 53 and 6's  . Heart disease Mother        aortic stenosis; pacemaker  . Uterine cancer Mother   . Breast cancer Mother   . Heart disease Father   . Cancer Father        pancreatic  . Depression Father   . Depression Sister   . Depression Brother   . Heart failure Brother   . Sleep apnea Brother    . Cancer Paternal Aunt 9       breast  . Breast cancer Paternal Aunt   . Cancer Maternal Grandmother 43       colon cancer  . Colon cancer Maternal Grandmother     ROS:  Pertinent items are noted in HPI.  Otherwise, a comprehensive ROS was negative.  Exam:   LMP 02/02/2003    Ht Readings from Last 3 Encounters:  09/11/17 5' 7.75" (1.721 m)  08/29/16 5' 7.5" (1.715 m)  05/28/16 5' 7.5" (1.715 m)    General appearance: alert, cooperative and appears stated age Head: Normocephalic, without obvious abnormality, atraumatic Neck: no adenopathy, supple, symmetrical, trachea midline and thyroid normal to inspection and palpation Lungs: clear to auscultation bilaterally Breasts: normal appearance, no masses or tenderness, No nipple retraction or dimpling, No nipple discharge or bleeding, No axillary or supraclavicular adenopathy Heart: regular rate and rhythm Abdomen: soft, non-tender; no masses,  no organomegaly Extremities: extremities normal, atraumatic, no cyanosis or edema Skin: Skin color, texture, turgor normal. No rashes or lesions Lymph nodes: Cervical, supraclavicular, and axillary nodes normal. No abnormal inguinal nodes palpated Neurologic: Grossly normal   Pelvic: External genitalia:  no lesions              Urethra:  normal appearing urethra with no masses, tenderness or lesions              Bartholin's and Skene's: normal                 Vagina: normal appearing vagina with normal color and discharge, no lesions              Cervix: no cervical motion tenderness, no lesions and normal appearance              Pap taken: No. Bimanual Exam:  Uterus:  normal size, contour, position, consistency, mobility, non-tender and anteverted              Adnexa: normal adnexa and no mass, fullness, tenderness               Rectovaginal: Confirms               Anus:  normal sphincter tone, no lesions  Chaperone present: yes  A:  Well Woman with normal exam  Menopausal no  HRT  Atrophic vaginitis  Depression,Insomnia with PCP management    P:   Reviewed health and wellness pertinent to exam  Discussed need to advise if vaginal bleeding  Discussed atrophic  vaginitis finding and etiology. Discussed treatment options with OTC products Replens, Olive Oil or Coconut oil, or estrogen. Patient will try either Olive oil or coconut oil. Discussed applying externally around vulva and vaginal opening prior to swimming to see if this resolves the issues. Be sure to change out of swim suit when possible. Will advise if no change.  Continue follow up with PCP as indicated.  Pap smear: no   counseled on breast self exam, mammography screening, feminine hygiene, menopause, adequate intake of calcium and vitamin D, diet and exercise  return annually or prn  An After Visit Summary was printed and given to the patient.

## 2017-12-15 ENCOUNTER — Other Ambulatory Visit: Payer: Self-pay | Admitting: Family Medicine

## 2017-12-15 DIAGNOSIS — F325 Major depressive disorder, single episode, in full remission: Secondary | ICD-10-CM

## 2018-01-01 DIAGNOSIS — Z23 Encounter for immunization: Secondary | ICD-10-CM | POA: Diagnosis not present

## 2018-01-14 DIAGNOSIS — H31002 Unspecified chorioretinal scars, left eye: Secondary | ICD-10-CM | POA: Diagnosis not present

## 2018-01-14 DIAGNOSIS — H5213 Myopia, bilateral: Secondary | ICD-10-CM | POA: Diagnosis not present

## 2018-02-16 ENCOUNTER — Other Ambulatory Visit: Payer: Self-pay | Admitting: Family Medicine

## 2018-02-16 DIAGNOSIS — L219 Seborrheic dermatitis, unspecified: Secondary | ICD-10-CM

## 2018-02-16 NOTE — Telephone Encounter (Signed)
Is this okay to refill? 

## 2018-03-02 ENCOUNTER — Other Ambulatory Visit: Payer: Self-pay | Admitting: Family Medicine

## 2018-03-02 DIAGNOSIS — J309 Allergic rhinitis, unspecified: Secondary | ICD-10-CM

## 2018-04-10 DIAGNOSIS — J0140 Acute pansinusitis, unspecified: Secondary | ICD-10-CM | POA: Diagnosis not present

## 2018-06-10 ENCOUNTER — Other Ambulatory Visit: Payer: Self-pay | Admitting: Family Medicine

## 2018-06-10 DIAGNOSIS — F325 Major depressive disorder, single episode, in full remission: Secondary | ICD-10-CM

## 2018-08-01 ENCOUNTER — Other Ambulatory Visit: Payer: Self-pay | Admitting: Family Medicine

## 2018-08-01 DIAGNOSIS — L219 Seborrheic dermatitis, unspecified: Secondary | ICD-10-CM

## 2018-08-03 NOTE — Telephone Encounter (Signed)
Is this okay to refill? 

## 2018-09-26 DIAGNOSIS — Z20828 Contact with and (suspected) exposure to other viral communicable diseases: Secondary | ICD-10-CM | POA: Diagnosis not present

## 2018-10-03 ENCOUNTER — Other Ambulatory Visit: Payer: Self-pay | Admitting: Family Medicine

## 2018-10-03 DIAGNOSIS — F325 Major depressive disorder, single episode, in full remission: Secondary | ICD-10-CM

## 2018-10-05 NOTE — Telephone Encounter (Signed)
This refill states "proactive request-to keep on hold"-are you okay with that?

## 2018-10-05 NOTE — Telephone Encounter (Signed)
I didn't see that.  But as I stated, she has an appt on 8/10.  As long as she doesn't need it before 8/10, I'll refill it at her visit

## 2018-10-05 NOTE — Telephone Encounter (Signed)
She has appt 8/10--see if she needs refill before then or not

## 2018-10-11 NOTE — Patient Instructions (Addendum)
HEALTH MAINTENANCE RECOMMENDATIONS:  It is recommended that you get at least 30 minutes of aerobic exercise at least 5 days/week (for weight loss, you may need as much as 60-90 minutes). This can be any activity that gets your heart rate up. This can be divided in 10-15 minute intervals if needed, but try and build up your endurance at least once a week.  Weight bearing exercise is also recommended twice weekly.  Eat a healthy diet with lots of vegetables, fruits and fiber.  "Colorful" foods have a lot of vitamins (ie green vegetables, tomatoes, red peppers, etc).  Limit sweet tea, regular sodas and alcoholic beverages, all of which has a lot of calories and sugar.  Up to 1 alcoholic drink daily may be beneficial for women (unless trying to lose weight, watch sugars).  Drink a lot of water.  Calcium recommendations are 1200-1500 mg daily (1500 mg for postmenopausal women or women without ovaries), and vitamin D 1000 IU daily.  This should be obtained from diet and/or supplements (vitamins), and calcium should not be taken all at once, but in divided doses.  Monthly self breast exams and yearly mammograms for women over the age of 44 is recommended.  Sunscreen of at least SPF 30 should be used on all sun-exposed parts of the skin when outside between the hours of 10 am and 4 pm (not just when at beach or pool, but even with exercise, golf, tennis, and yard work!)  Use a sunscreen that says "broad spectrum" so it covers both UVA and UVB rays, and make sure to reapply every 1-2 hours.  Remember to change the batteries in your smoke detectors when changing your clock times in the spring and fall.  Use your seat belt every time you are in a car, and please drive safely and not be distracted with cell phones and texting while driving.    Stephanie Harrell , Thank you for taking time to come for your Medicare Wellness Visit. I appreciate your ongoing commitment to your health goals. Please review the  following plan we discussed and let me know if I can assist you in the future.    This is a list of the screening recommended for you and due dates:  Health Maintenance  Topic Date Due  . Mammogram  06/24/2019  . Flu Shot  11/03/2018  . Colon Cancer Screening  06/23/2020  . Tetanus Vaccine  06/11/2023  . DEXA scan (bone density measurement)  Completed  .  Hepatitis C: One time screening is recommended by Center for Disease Control  (CDC) for  adults born from [birth year] through 1963.   Completed  . Pneumonia vaccines  Completed   I recommend yearly mammograms (3D). Please call to schedule if you haven't already done so.  Remember to continue to get the "high dose" flu shot every Fall.  I'm not sure where the date above came for colon cancer screening.  I have that your last colonoscopy was 03/2016 at [facility], and that 3 year follow-up was recommended (so should be due 03/2019).    We discussed using famotidine (pepcid) in place of Protonix, prior to meals which may trigger heartburn.  You may use it up to twice daily if needed on a regular basis.  You may continue to use Tums as needed.    Try cutting back on wine intake--just one daily, and because it may be contributing to sleep problems, consider not even having daily.  Derm evaluation is recommended.  A  routine hearing screening is recommended (check with the centers near you, they may offer free screenings).

## 2018-10-11 NOTE — Progress Notes (Signed)
Chief Complaint  Patient presents with  . AWV    AWV    Stephanie Harrell is a 73 y.o. female who presents for Medicare annual wellness visit and follow-up on chronic medical conditions.  She has the following concerns:  Depression: Moods are well controlled on sertraline.Titrated down from 135m to 754mlast year, and she reports she cut back further to 5067mver the last year. 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 At the 59m11mse she noticed more fatigue, went back up ?60 (a slight bite, not 1/2), felt less tired at the higher dose. She had been trying to get by with as little medication as possible.  Insomnia: She hasn't been sleeping well since Christmas. She has trouble falling asleep, staying asleep.  Sometimes the Trazadone helps her fall asleep, not always.  Tried one of her daughter's herbal meds which helped, cannot recall the name.  Hasn't tried taking the full tablet of Trazadone. Possibly related to increased anxiety, and cutting back of sertraline dose as reported above.  Allergies: She has perennial allergies--mostly related to leaves, worse seasonally in fall and spring. Notices it some now, using Flonase daily, along with antihistamine just prn (not currently--sudafed, benadryl or claritin). Uses neti-pot prn. She notes slight decrease in hearing in both ears, L>R, gradual with age.She feels this has progressed some over the years. Denies tinnitus.  GERD: Symptoms are controlled mainly with diet, uses OTC Prevacid rarely, if needed, but wants to try and get off of this medication (her children have expressed concerns). She denies dysphagia.  H/o frequent UTI's. Previously used nitrofurantoin after intercourse for prevention of infections. She has not been taking this preventatively for years; hasn't had a UTI in 6 months.  She took it for several days with good results with her last infection. She still has some at home. She denies any symptoms  currently.  arthritis pain in her right thumb.  CBD oil helps. Denies significant swelling/redness.  Unchanged/stable.  Seborrheic dermatitis on the scalp:  Doing well with triamcinolone lotion prn, uses it about once a month.  She plans to see dermatologist soon for this and full skin check.  OA bilateral knees:She saw [facility] in the pastwho sent her to PT, and is doing well with home exercise program. She also had PT for scoliosis and back pain, does her exercises and is doing well overall. Doing well with no problems, as long as she does her exercises.  Some recurrence when she stopped home exercises for a few weeks in the past.  Review of chart shows that her TSH tends to be running on the low side, being monitored.  She denies changes to hair/skin/bowels/energy/weight, palpitations. She finds she doesn't tolerate the heat well, no other symptoms. Lab Results  Component Value Date   TSH 0.436 (L) 09/11/2017    Immunization History  Administered Date(s) Administered  . DTaP 03/04/2004  . Influenza Split 12/27/2010, 12/03/2011, 01/02/2013  . Influenza, High Dose Seasonal PF 12/03/2016, 01/01/2018  . Influenza,inj,quad, With Preservative 10/10/2014  . Pneumococcal Conjugate-13 06/10/2013  . Pneumococcal Polysaccharide-23 08/24/2014  . Tdap 06/10/2013  . Zoster 08/17/2013  . Zoster Recombinat (Shingrix) 09/10/2017, 01/06/2018   Last Pap smear: 05/2015 at GYN, benign; last visit 09/2017. Has to schedule this year's. Last mammogram: 06/2017 Last colonoscopy:03/2016--Dr. WolfEliberto Ivoryke): polyps and internal hemorrhoids. 3 yr f/u recommended Last DEXA:10/2015 normal Dentist: twice yearly  Ophtho: every 1-2 years Exercise:Swims 3 days/week (30+ mins in lap pool),  walks on the days she doesn't swim (but not when it is hot); uses weighted hula hoop. Pilates and PT HEP at least 3 days/week if not daily.  Has weights/kettlebell at home not using recently. Hepatitis C screen done through  GYN in March 2017, negative Vitamin D-OH level normal at 47(checked by GYN 05/2016) Lipid screen: Lab Results  Component Value Date   CHOL 214 (H) 08/29/2016   HDL 106 08/29/2016   LDLCALC 91 08/29/2016   TRIG 85 08/29/2016   CHOLHDL 2.0 08/29/2016    Other doctors caring for patient include: GYN: [place] Ophtho: [place] Ortho: [facility] (for knee pain and scoliosis) Dentist: [facility] in [place] GI:Dr. Rogers Blocker ([facility])  Depression screen: Negative Fall screen:negative Functional Status screen: notable for some hearing decline. Has always had issues with long-term memory, has had to "work hard". Not good with names.She thinks it is a little worse now, but related to being very tired (not a problem when not tired). Mini-Cog screen: normal (5)  End of Life Discussion: Patient hasa living will and medical power of attorney, but is waiting on the attorney (rather than getting it notarized).  Plans to do soon.  Past Medical History:  Diagnosis Date  . Allergic rhinitis, cause unspecified    mold, mildew, and seasonal allergies  . Anxiety   . 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 Depressive disorder, not elsewhere classified   . 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 Endometriosis   . Fatigue   . FHx: breast cancer   . FHx: colon cancer   . Frequent UTI    h/o  . GERD (gastroesophageal reflux disease)   . Insomnia   . MVP (mitral valve prolapse)    h/o  . Postmenopausal   . WBC decreased 5/06   borderline (3.5-4.0)    Past Surgical History:  Procedure Laterality Date  . LAPAROSCOPY  1978   (prior to getting pregnant, for endometriosis with mini lap with wedge resection of ovary    Social History   Socioeconomic History  . Marital status: Divorced    Spouse name: Not on file  . Number of children: 2  . Years of education: Not on file  . Highest education level: Not on file   Occupational History  . Occupation: Training and development officer  . Occupation: therapist  Social Needs  . Financial resource strain: Not on file  . Food insecurity    Worry: Not on file    Inability: Not on file  . Transportation needs    Medical: Not on file    Non-medical: Not on file  Tobacco Use  . Smoking status: Former Smoker    Quit date: 03/05/2011    Years since quitting: 7.6  . Smokeless tobacco: Never Used  Substance and Sexual Activity  . Alcohol use: Yes    Alcohol/week: 10.0 standard drinks    Types: 10 Standard drinks or equivalent per week    Comment: 2 glasses of wine daily  . Drug use: No  . Sexual activity: Yes    Partners: Male    Birth control/protection: Post-menopausal  Lifestyle  . Physical activity    Days per week: Not on file    Minutes per session: Not on file  . Stress: Not on file  Relationships  . Social Herbalist on phone: Not on file    Gets together: Not on file  Attends religious service: Not on file    Active member of club or organization: Not on file    Attends meetings of clubs or organizations: Not on file    Relationship status: Not on file  . Intimate partner violence    Fear of current or ex partner: Not on file    Emotionally abused: Not on file    Physically abused: Not on file    Forced sexual activity: Not on file  Other Topics Concern  . Not on file  Social History Narrative   Divorced.  Amicable relationship with her ex-husband.   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  She lives in [place],  with her boyfriend.  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 Daughters are both in [place]--Elizabeth will be moving to [place].   Apolonio Schneiders is engaged to be married    Family History  Problem Relation Age of Onset  . Hypertension Mother   . Cancer Mother        uterine cancer @ 64; Breast cancer at 27 and 32's  . Heart disease Mother        aortic stenosis; pacemaker  . Uterine cancer Mother    . Breast cancer Mother   . Heart disease Father   . Cancer Father        pancreatic  . Depression Father        PTSD  . Depression Sister   . Depression Brother   . Heart failure Brother   . Sleep apnea Brother   . Cancer Paternal Aunt 43       breast  . Breast cancer Paternal Aunt   . Cancer Maternal Grandmother 83       colon cancer  . Colon cancer Maternal Grandmother     Outpatient Encounter Medications as of 10/12/2018  Medication Sig Note  . calcium-vitamin D (OSCAL WITH D) 500-200 MG-UNIT tablet Take 1 tablet by mouth 2 (two) times daily.    . fluticasone (FLONASE) 50 MCG/ACT nasal spray USE 2 SPRAYS INTO EACH NOSTRIL EVERY DAY   . loratadine (CLARITIN) 10 MG tablet Take 10 mg by mouth daily. 08/24/2014: Uses prn  . Multiple Vitamin (MULTIVITAMIN) tablet Take 1 tablet by mouth daily.   . NON FORMULARY Take 3 capsules by mouth daily. 08/29/2016: Cosamine ASU  . Omega 3-6-9 Fatty Acids (OMEGA 3-6-9 COMPLEX PO) Take 3 capsules by mouth daily.   . pseudoephedrine (SUDAFED) 30 MG tablet Take 30 mg by mouth every 4 (four) hours as needed for congestion. 06/10/2013: Uses prn sinus pain  . sertraline (ZOLOFT) 50 MG tablet TAKE 1 TO 2 TABLETS BY MOUTH DAILY AT BEDTIME AS DIRECTED 10/12/2018: Taking a little over 1 tablet daily  . traZODone (DESYREL) 50 MG tablet TAKE 1/2 TABLET BY MOUTH AT BEDTIME. YOU MAY TAKE A FULL TABLET AT BEDTIME IF NEEDED.   Marland Kitchen lansoprazole (PREVACID) 15 MG capsule Take 30 mg by mouth daily.   10/12/2018: Uses very rarely prn only  . triamcinolone lotion (KENALOG) 0.1 % APPLY TO AFFECTED AREA ON SCALP TWICE A DAY FOR 2-4 WEEKS AS NEEDED FOR FLARES (Patient not taking: Reported on 10/12/2018)   . [DISCONTINUED] nitrofurantoin, macrocrystal-monohydrate, (MACROBID) 100 MG capsule 1 capsule post coital prn    No facility-administered encounter medications on file as of 10/12/2018.     Allergies  Allergen Reactions  . Codeine Other (See Comments)    Feels faint.     ROS: The patient denies anorexia, fever, weight  changes, headaches, vision changes, ear pain, sore throat, breast concerns, palpitations, dizziness, syncope, dyspnea on exertion, cough, swelling, nausea, vomiting, diarrhea, abdominal pain, melena, hematochezia, hematuria, incontinence, dysuria, vaginal bleeding, discharge, odor or itch, genital lesions, joint pains, numbness, tingling, weakness, tremor, suspicious skin lesions, depression, anxiety, abnormal bleeding/bruising, or enlarged lymph nodes. Allergies are controlled with her current regimen. Occasional sinus headache.  Sometimes she will get a sharp pain in her chest which she relates to her mitral valve "getting caught"--if too much stress or caffeine. Resolves quickly.Knife-like and very short-lived. 2x/year, unchanged x years. No exertional chest pain.  Denies dysphagia. Occasional constipation. Moods and insomnia per HPI, increased anxiety and worsening insomnia since Christmas. Back and knee pain resolved (as long as she does home exercises).  PHYSICAL EXAM:  BP 110/60   Pulse 65   Temp 98.4 F (36.9 C) (Temporal)   Resp 16   Ht 5' 8"  (1.727 m)   Wt 142 lb 3.2 oz (64.5 kg)   LMP 02/02/2003   SpO2 97%   BMI 21.62 kg/m   Wt Readings from Last 3 Encounters:  10/12/18 142 lb 3.2 oz (64.5 kg)  09/30/17 142 lb 9.6 oz (64.7 kg)  09/11/17 139 lb 9.6 oz (63.3 kg)    General Appearance:   Alert, cooperative, no distress, appears younger than her stated age. She was not in gown for visit today.  Head:   Normocephalic, without obvious abnormality, atraumatic  Eyes:   PERRL, conjunctiva/corneas clear, EOM's intact, fundi benign  Ears:   Normal TM's and external ear canals  Nose:  Not examined (wearing mask due to COVID-19 pandemic)  Throat:  Not examined (wearing mask due to COVID-19 pandemic)  Neck:  Supple, no lymphadenopathy; thyroid: noenlargement/ tenderness/nodules; no carotid bruit or JVD.    Back:  Spine nontender, no CVAtenderness. Scoliosis noted, with left hip slightly higher, asymmetric skin folds. Nontender, no muscle spasm. Skin is without lesions of concern on her back.  Lungs:   Clear to auscultation bilaterally without wheezes, rales orronchi; respirations unlabored  Chest Wall:   No tenderness or deformity  Heart:   Regular rate and rhythm, S1 and S2 normal, no murmur, rub or gallop. No click appreciable  Breast Exam:   Deferred to GYN  Abdomen:   Soft, non-tender, nondistended, normoactive bowel sounds, no masses, no hepatosplenomegaly  Genitalia:   Deferred to GYN     Extremities:  No clubbing, cyanosis or edema  Pulses:  2+ and symmetric all extremities  Skin:  Skin color, texture, turgor normal, no rashes or lesions. Skin concern on her chest--see below.  Lymph nodes:  Cervical, supraclavicular, and axillary nodes normal  Neurologic:  CNII-XII intact, normal strength, sensation and gait; reflexes 2+ and symmetric throughout  Psych: Normal mood, affect, hygiene and grooming  6 x 92m irregularly shaped raised pigmented lesion in lower middle portion of chest.  Present x 1 year, slight increase in size noted per pt.   ASSESSMENT/PLAN:  Encounter for Medicare annual wellness exam  Allergic rhinitis, unspecified seasonality, unspecified trigger - continue Flonase; antihistamines, sudafed and net-pot prn  Seborrheic dermatitis of scalp - controlled with TAC lotion prn   Abnormal TSH - Plan: TSH, T4, Free  Medication monitoring encounter - Plan: Comprehensive metabolic panel, CBC with Differential/Platelet  Fatigue, unspecified type - Plan: TSH, T4, Free  Depression, major, in remission (HP[place]ons - Some recurrence of anxiety and worsening sleep since she cut back; increase back to 78m(and further up to 10061mif needed) -  Plan: sertraline (ZOLOFT) 50 MG tablet  Insomnia, unspecified type -  previously controlled with 67m trazadone; can increase to 526mnow; potentially can cut back if sleep improves with higher dose of sertraline - Plan: traZODone (DESYREL) 50 MG tablet   Derm eval recommended for abnormal mole of recent onset on her chest, as well as full skin check.  Due to schedule mammo and yearly GYN exam, reminded to schedule.   TSH, free T4, consider c-met and CBC Lipids not needed  Discussed monthly self breast exams and yearly mammograms; at least 30 minutes of aerobic activity at least 5 days/week, weight bearing exercise at least 2x/wk; proper sunscreen use reviewed; healthy diet, including goals of calcium and vitamin D intake and alcohol recommendations (less than or equal to 1 drink/day) reviewed; regular seatbelt use; changing batteries in smoke detectors. Immunization recommendations discussed-Yearly flu shots (high dose) recommended in fall.Colonoscopy recommendations reviewed, due again 03/2019.  Encouraged to cut back on alcohol (which can adversely effect her sleep).  Living Will and Healthcare power of attorney discussed, awaiting meeting with attorney; asked for copy when completed.  Full code, full care.   F/u 1 year, sooner prn. Patient may get a PCP closer to home ([facility]).    Medicare Attestation I have personally reviewed: The patient's medical and social history Their use of alcohol, tobacco or illicit drugs Their current medications and supplements The patient's functional ability including ADLs,fall risks, home safety risks, cognitive, and hearing and visual impairment Diet and physical activities Evidence for depression or mood disorders  The patient's weight, height, BMI, and visual acuity have been recorded in the chart.  I have made referrals, counseling, and provided education to the patient based on review of the above and I have provided the patient with a written personalized care plan for  preventive services.

## 2018-10-12 ENCOUNTER — Other Ambulatory Visit: Payer: Self-pay

## 2018-10-12 ENCOUNTER — Encounter: Payer: Self-pay | Admitting: Family Medicine

## 2018-10-12 ENCOUNTER — Ambulatory Visit (INDEPENDENT_AMBULATORY_CARE_PROVIDER_SITE_OTHER): Payer: Medicare Other | Admitting: Family Medicine

## 2018-10-12 VITALS — BP 110/60 | HR 65 | Temp 98.4°F | Resp 16 | Ht 68.0 in | Wt 142.2 lb

## 2018-10-12 DIAGNOSIS — Z5181 Encounter for therapeutic drug level monitoring: Secondary | ICD-10-CM | POA: Diagnosis not present

## 2018-10-12 DIAGNOSIS — G4700 Insomnia, unspecified: Secondary | ICD-10-CM

## 2018-10-12 DIAGNOSIS — R5383 Other fatigue: Secondary | ICD-10-CM

## 2018-10-12 DIAGNOSIS — L219 Seborrheic dermatitis, unspecified: Secondary | ICD-10-CM

## 2018-10-12 DIAGNOSIS — Z0000 Encounter for general adult medical examination without abnormal findings: Secondary | ICD-10-CM | POA: Diagnosis not present

## 2018-10-12 DIAGNOSIS — J309 Allergic rhinitis, unspecified: Secondary | ICD-10-CM | POA: Diagnosis not present

## 2018-10-12 DIAGNOSIS — F325 Major depressive disorder, single episode, in full remission: Secondary | ICD-10-CM | POA: Diagnosis not present

## 2018-10-12 DIAGNOSIS — R7989 Other specified abnormal findings of blood chemistry: Secondary | ICD-10-CM | POA: Diagnosis not present

## 2018-10-12 MED ORDER — SERTRALINE HCL 50 MG PO TABS: 75.0000 mg | ORAL_TABLET | Freq: Every day | ORAL | 3 refills | Status: DC

## 2018-10-12 MED ORDER — TRAZODONE HCL 50 MG PO TABS: ORAL_TABLET | ORAL | 3 refills | Status: DC

## 2018-10-13 LAB — COMPREHENSIVE METABOLIC PANEL
ALT: 16 IU/L (ref 0–32)
AST: 27 IU/L (ref 0–40)
Albumin/Globulin Ratio: 2.4 — ABNORMAL HIGH (ref 1.2–2.2)
Albumin: 4.7 g/dL (ref 3.8–4.8)
Alkaline Phosphatase: 68 IU/L (ref 39–117)
BUN/Creatinine Ratio: 14 (ref 12–28)
BUN: 12 mg/dL (ref 8–27)
Bilirubin Total: 0.8 mg/dL (ref 0.0–1.2)
CO2: 27 mmol/L (ref 20–29)
Calcium: 10.1 mg/dL (ref 8.7–10.3)
Chloride: 100 mmol/L (ref 96–106)
Creatinine, Ser: 0.86 mg/dL (ref 0.57–1.00)
GFR calc Af Amer: 79 mL/min/{1.73_m2} (ref >59)
GFR calc non Af Amer: 69 mL/min/{1.73_m2} (ref >59)
Globulin, Total: 2 g/dL (ref 1.5–4.5)
Glucose: 98 mg/dL (ref 65–99)
Potassium: 4.7 mmol/L (ref 3.5–5.2)
Sodium: 139 mmol/L (ref 134–144)
Total Protein: 6.7 g/dL (ref 6.0–8.5)

## 2018-10-13 LAB — CBC WITH DIFFERENTIAL/PLATELET
Basophils Absolute: 0 10*3/uL (ref 0.0–0.2)
Basos: 1 %
EOS (ABSOLUTE): 0.1 10*3/uL (ref 0.0–0.4)
Eos: 2 %
Hematocrit: 44 % (ref 34.0–46.6)
Hemoglobin: 15.3 g/dL (ref 11.1–15.9)
Immature Grans (Abs): 0 10*3/uL (ref 0.0–0.1)
Immature Granulocytes: 0 %
Lymphocytes Absolute: 1.2 10*3/uL (ref 0.7–3.1)
Lymphs: 30 %
MCH: 32.8 pg (ref 26.6–33.0)
MCHC: 34.8 g/dL (ref 31.5–35.7)
MCV: 94 fL (ref 79–97)
Monocytes Absolute: 0.4 10*3/uL (ref 0.1–0.9)
Monocytes: 9 %
Neutrophils Absolute: 2.4 10*3/uL (ref 1.4–7.0)
Neutrophils: 58 %
Platelets: 222 10*3/uL (ref 150–450)
RBC: 4.67 x10E6/uL (ref 3.77–5.28)
RDW: 11.8 % (ref 11.7–15.4)
WBC: 4 10*3/uL (ref 3.4–10.8)

## 2018-10-13 LAB — TSH: TSH: 0.495 u[IU]/mL (ref 0.450–4.500)

## 2018-10-13 LAB — T4, FREE: Free T4: 1.05 ng/dL (ref 0.82–1.77)

## 2018-11-02 DIAGNOSIS — L820 Inflamed seborrheic keratosis: Secondary | ICD-10-CM | POA: Diagnosis not present

## 2018-11-02 DIAGNOSIS — D1801 Hemangioma of skin and subcutaneous tissue: Secondary | ICD-10-CM | POA: Diagnosis not present

## 2018-11-02 DIAGNOSIS — Z1283 Encounter for screening for malignant neoplasm of skin: Secondary | ICD-10-CM | POA: Diagnosis not present

## 2018-11-02 DIAGNOSIS — L700 Acne vulgaris: Secondary | ICD-10-CM | POA: Diagnosis not present

## 2018-11-02 DIAGNOSIS — L812 Freckles: Secondary | ICD-10-CM | POA: Diagnosis not present

## 2018-11-02 DIAGNOSIS — D225 Melanocytic nevi of trunk: Secondary | ICD-10-CM | POA: Diagnosis not present

## 2018-11-02 DIAGNOSIS — L821 Other seborrheic keratosis: Secondary | ICD-10-CM | POA: Diagnosis not present

## 2018-11-02 DIAGNOSIS — L218 Other seborrheic dermatitis: Secondary | ICD-10-CM | POA: Diagnosis not present

## 2018-11-16 DIAGNOSIS — Z23 Encounter for immunization: Secondary | ICD-10-CM | POA: Diagnosis not present

## 2018-12-21 ENCOUNTER — Other Ambulatory Visit: Payer: Self-pay | Admitting: Family Medicine

## 2018-12-21 DIAGNOSIS — L219 Seborrheic dermatitis, unspecified: Secondary | ICD-10-CM

## 2018-12-22 NOTE — Telephone Encounter (Signed)
Is this okay to refill? 

## 2019-01-22 DIAGNOSIS — H5213 Myopia, bilateral: Secondary | ICD-10-CM | POA: Diagnosis not present

## 2019-01-22 DIAGNOSIS — H35432 Paving stone degeneration of retina, left eye: Secondary | ICD-10-CM | POA: Diagnosis not present

## 2019-03-27 ENCOUNTER — Ambulatory Visit: Payer: Medicare Other | Attending: Internal Medicine

## 2019-03-27 DIAGNOSIS — Z23 Encounter for immunization: Secondary | ICD-10-CM | POA: Insufficient documentation

## 2019-03-27 NOTE — Progress Notes (Signed)
   Covid-19 Vaccination Clinic  Name:  Stephanie Harrell    MRN: [id number] DOB: 18-Feb-1946  03/27/2019  Ms. Asaro was observed post Covid-19 immunization for 15 minutes without incidence. She was provided with Vaccine Information Sheet and instruction to access the V-Safe system.   Ms. Gerads was instructed to call 911 with any severe reactions post vaccine: Marland Kitchen Difficulty breathing  . Swelling of your face and throat  . A fast heartbeat  . A bad rash all over your body  . Dizziness and weakness    Immunizations Administered    Name Date Dose VIS Date Route   Pfizer COVID-19 Vaccine 03/27/2019  3:21 PM 0.3 mL 02/12/2019 Intramuscular   Manufacturer: [place]   Lot: [id number]   [place]: [id number]

## 2019-04-02 DIAGNOSIS — Z1231 Encounter for screening mammogram for malignant neoplasm of breast: Secondary | ICD-10-CM | POA: Diagnosis not present

## 2019-04-02 LAB — HM MAMMOGRAPHY

## 2019-04-09 DIAGNOSIS — N6489 Other specified disorders of breast: Secondary | ICD-10-CM | POA: Diagnosis not present

## 2019-04-09 DIAGNOSIS — R922 Inconclusive mammogram: Secondary | ICD-10-CM | POA: Diagnosis not present

## 2019-04-09 LAB — HM MAMMOGRAPHY

## 2019-04-14 ENCOUNTER — Encounter: Payer: Self-pay | Admitting: *Deleted

## 2019-04-18 ENCOUNTER — Ambulatory Visit: Payer: Medicare Other | Attending: Internal Medicine

## 2019-04-18 DIAGNOSIS — Z23 Encounter for immunization: Secondary | ICD-10-CM | POA: Insufficient documentation

## 2019-04-18 NOTE — Progress Notes (Signed)
   Covid-19 Vaccination Clinic  Name:  Stephanie Harrell    MRN: [id number] DOB: 06-05-1945  04/18/2019  [name] was observed post Covid-19 immunization for 15 minutes without incidence. She was provided with Vaccine Information Sheet and instruction to access the V-Safe system.   [name] was instructed to call 911 with any severe reactions post vaccine: Marland Kitchen Difficulty breathing  . Swelling of your face and throat  . A fast heartbeat  . A bad rash all over your body  . Dizziness and weakness    Immunizations Administered    Name Date Dose VIS Date Route   Pfizer COVID-19 Vaccine 04/18/2019 11:50 AM 0.3 mL 02/12/2019 Intramuscular   Manufacturer: [employer], [employer]   Lot: [id number]   [place]: [id number]

## 2019-04-21 ENCOUNTER — Encounter: Payer: Self-pay | Admitting: Family Medicine

## 2019-04-24 DIAGNOSIS — Z20828 Contact with and (suspected) exposure to other viral communicable diseases: Secondary | ICD-10-CM | POA: Diagnosis not present

## 2019-04-29 ENCOUNTER — Other Ambulatory Visit: Payer: Self-pay | Admitting: Family Medicine

## 2019-04-29 DIAGNOSIS — L219 Seborrheic dermatitis, unspecified: Secondary | ICD-10-CM

## 2019-04-29 DIAGNOSIS — J309 Allergic rhinitis, unspecified: Secondary | ICD-10-CM

## 2019-04-29 NOTE — Telephone Encounter (Signed)
Is this lotion ok to refill?

## 2019-05-25 ENCOUNTER — Encounter: Payer: Self-pay | Admitting: Certified Nurse Midwife

## 2019-06-01 DIAGNOSIS — Z20822 Contact with and (suspected) exposure to covid-19: Secondary | ICD-10-CM | POA: Diagnosis not present

## 2019-06-01 DIAGNOSIS — Z01812 Encounter for preprocedural laboratory examination: Secondary | ICD-10-CM | POA: Diagnosis not present

## 2019-06-04 DIAGNOSIS — K640 First degree hemorrhoids: Secondary | ICD-10-CM | POA: Diagnosis not present

## 2019-06-04 DIAGNOSIS — Z86010 Personal history of colonic polyps: Secondary | ICD-10-CM | POA: Diagnosis not present

## 2019-06-04 DIAGNOSIS — Z1211 Encounter for screening for malignant neoplasm of colon: Secondary | ICD-10-CM | POA: Diagnosis not present

## 2019-06-04 DIAGNOSIS — K635 Polyp of colon: Secondary | ICD-10-CM | POA: Diagnosis not present

## 2019-06-04 DIAGNOSIS — Z79899 Other long term (current) drug therapy: Secondary | ICD-10-CM | POA: Diagnosis not present

## 2019-06-04 DIAGNOSIS — Z885 Allergy status to narcotic agent status: Secondary | ICD-10-CM | POA: Diagnosis not present

## 2019-06-04 LAB — HM COLONOSCOPY

## 2019-07-02 ENCOUNTER — Encounter: Payer: Self-pay | Admitting: Family Medicine

## 2019-10-24 DIAGNOSIS — Z23 Encounter for immunization: Secondary | ICD-10-CM | POA: Diagnosis not present

## 2019-10-26 ENCOUNTER — Other Ambulatory Visit: Payer: Self-pay | Admitting: Family Medicine

## 2019-10-26 DIAGNOSIS — F325 Major depressive disorder, single episode, in full remission: Secondary | ICD-10-CM

## 2019-10-26 NOTE — Telephone Encounter (Signed)
Pt has upcoming appt in october 

## 2019-11-05 DIAGNOSIS — Z20822 Contact with and (suspected) exposure to covid-19: Secondary | ICD-10-CM | POA: Diagnosis not present

## 2019-11-12 ENCOUNTER — Telehealth: Payer: Self-pay | Admitting: Family Medicine

## 2019-11-12 NOTE — Telephone Encounter (Signed)
Pt tested positive on 11/05/19 for COVID, now feeling better feels like still has head cold going into chest.  Wants to know recommendations still very tired.  Wants to know how long she should quarantine and what else she should do.  Please call pt.

## 2019-11-13 ENCOUNTER — Encounter: Payer: Self-pay | Admitting: Family Medicine

## 2019-11-13 NOTE — Telephone Encounter (Signed)
Sent patient message Saturday morning.  I had let folks know at huddle that I wouldn't be able to check computer after 12 on Friday, as I would be driving (out of town), and to call with things that were urgent (which this wasn't necessarily urgent).  Hoping she was informed that it was my day off, and not to necessarily expect a reply that day.

## 2019-11-19 DIAGNOSIS — Z20822 Contact with and (suspected) exposure to covid-19: Secondary | ICD-10-CM | POA: Diagnosis not present

## 2019-11-19 NOTE — Telephone Encounter (Signed)
Yes pt was informed that Dr. Tomi Bamberger was off and pt was ok waiting.  I called to follow up with pt and she is better, she is still tired, she was tested again today.  She no longer has fever and cough and congestion almost all resolved, just lingering fatigue and inability to concentrate.  Advised to let us know if she needs anything.

## 2019-12-02 ENCOUNTER — Other Ambulatory Visit: Payer: Self-pay | Admitting: Family Medicine

## 2019-12-02 DIAGNOSIS — Z1211 Encounter for screening for malignant neoplasm of colon: Secondary | ICD-10-CM | POA: Diagnosis not present

## 2019-12-02 DIAGNOSIS — Z01419 Encounter for gynecological examination (general) (routine) without abnormal findings: Secondary | ICD-10-CM | POA: Diagnosis not present

## 2019-12-02 DIAGNOSIS — G4700 Insomnia, unspecified: Secondary | ICD-10-CM

## 2019-12-02 DIAGNOSIS — L219 Seborrheic dermatitis, unspecified: Secondary | ICD-10-CM

## 2019-12-02 DIAGNOSIS — Z6821 Body mass index (BMI) 21.0-21.9, adult: Secondary | ICD-10-CM | POA: Diagnosis not present

## 2019-12-02 DIAGNOSIS — Z1231 Encounter for screening mammogram for malignant neoplasm of breast: Secondary | ICD-10-CM | POA: Diagnosis not present

## 2019-12-02 NOTE — Telephone Encounter (Signed)
She has an appointment in a month.  If she needs them, fine to send in just the 90d, but no additional refills.

## 2019-12-02 NOTE — Telephone Encounter (Signed)
Are these okay to refill? 

## 2019-12-03 DIAGNOSIS — Z23 Encounter for immunization: Secondary | ICD-10-CM | POA: Diagnosis not present

## 2019-12-04 DIAGNOSIS — Z20822 Contact with and (suspected) exposure to covid-19: Secondary | ICD-10-CM | POA: Diagnosis not present

## 2019-12-10 DIAGNOSIS — Z20822 Contact with and (suspected) exposure to covid-19: Secondary | ICD-10-CM | POA: Diagnosis not present

## 2019-12-28 NOTE — Patient Instructions (Addendum)
  HEALTH MAINTENANCE RECOMMENDATIONS:  It is recommended that you get at least 30 minutes of aerobic exercise at least 5 days/week (for weight loss, you may need as much as 60-90 minutes). This can be any activity that gets your heart rate up. This can be divided in 10-15 minute intervals if needed, but try and build up your endurance at least once a week.  Weight bearing exercise is also recommended twice weekly.  Eat a healthy diet with lots of vegetables, fruits and fiber.  "Colorful" foods have a lot of vitamins (ie green vegetables, tomatoes, red peppers, etc).  Limit sweet tea, regular sodas and alcoholic beverages, all of which has a lot of calories and sugar.  Up to 1 alcoholic drink daily may be beneficial for women (unless trying to lose weight, watch sugars).  Drink a lot of water.  Calcium recommendations are 1200-1500 mg daily (1500 mg for postmenopausal women or women without ovaries), and vitamin D 1000 IU daily.  This should be obtained from diet and/or supplements (vitamins), and calcium should not be taken all at once, but in divided doses.  Monthly self breast exams and yearly mammograms for women over the age of 41 is recommended.  Sunscreen of at least SPF 30 should be used on all sun-exposed parts of the skin when outside between the hours of 10 am and 4 pm (not just when at beach or pool, but even with exercise, golf, tennis, and yard work!)  Use a sunscreen that says "broad spectrum" so it covers both UVA and UVB rays, and make sure to reapply every 1-2 hours.  Remember to change the batteries in your smoke detectors when changing your clock times in the spring and fall. Carbon monoxide detectors are recommended for your home.  Use your seat belt every time you are in a car, and please drive safely and not be distracted with cell phones and texting while driving.   Stephanie Harrell , Thank you for taking time to come for your Medicare Wellness Visit. I appreciate your ongoing  commitment to your health goals. Please review the following plan we discussed and let me know if I can assist you in the future.    This is a list of the screening recommended for you and due dates:  Health Maintenance  Topic Date Due  . Mammogram  04/08/2020  . Tetanus Vaccine  06/11/2023  . Colon Cancer Screening  06/03/2024  . Flu Shot  Completed  . DEXA scan (bone density measurement)  Completed  . COVID-19 Vaccine  Completed  .  Hepatitis C: One time screening is recommended by Center for Disease Control  (CDC) for  adults born from [birth year] through 1963.   Completed  . Pneumonia vaccines  Completed

## 2019-12-28 NOTE — Progress Notes (Signed)
Chief Complaint  Patient presents with  . Medicare Wellness    fasting (in lab) AWV visit. Did have a fall about a month ago, slipped on wet porch and caught herself with right hand-swollen and painful now.     Stephanie Harrell is a 74 y.o. female who presents for Medicare annual wellness visit and follow-up on chronic medical conditions.  She has the following concerns:  She had breakthrough COVID infection in 11/2019.  Felt like allergies and a cold.  She had exposure 8/26-29, tested on 9/3. She has residual fatigue, brain fog and some cough.  No shortness of breath.  She has some achiness in her arms (and some chronic in her back, unchanged). She has noted some improvement overall.  Mainly she feels worst at the end of the day, needs a nap, can't think hard about things or make decisions at the end of the day. She can take evening clients if she has a nap in the afternoon.  She slipped on a wet porch about a month ago.  She had increased pain at the base of the right thumb (where she already had some arthritic change/pain).  CBD oil helps. It is improving. Never had much swelling in the area, just more painful than baseline.  Depression: Moods are well controlled on sertraline.Titrated down from 160m to 756m2 years ago, but when she further cut back to 5087mnoticed more anxiety and more fatigue. She went back up slightly ("slight bite, less than 1/2 tablet) at her visit last year.  She has been back on 25m65mnce her last visit, and has been working well.  Insomnia: She previously had trouble falling asleep, staying asleep. She is now taking the full trazadone tablet nightly, which does helps her fall asleep, not necessarily stay asleep. Insomnia has been worse since she had COVID  Allergies: She has perennial allergies--mostly related to leaves, worse seasonally in fall and spring. She is using Flonase daily, along with Neti-pot.  Uses sudafed prn infrequent (once a month), not taking  antihistamines.  She notes slight decrease in hearing in both ears, L>R, gradual with age.She noticed this gradually over the years, but unchanged from last year. Denies tinnitus.  GERD: Symptoms are controlled mainly with diet, and Tums prn.  She no longer takes Prevacid.She denies dysphagia.  H/o MVP.  She is concerned, wonders if she needs to see a cardiologist.  She has MVP.  Sometimes she gets a sharp, knife-like pain in her chest, which she relates to her mitral valve. It is shortlived, resolves quickly and completely.  It used to happen just 2x/year, for many years.  She now reports that it is happening more frequently now, maybe every 2 months, but not as severe, just a "little knife". She notices it more if she "over-caffeinates, too much wine, or a lot of stress".  She denies exertional chest pain. She denies any DOE, edema.  H/o frequent UTI's. Previously used nitrofurantoin after intercourse for prevention of infections. She has not been taking this preventatively for years. She denies any symptoms currently.  Arthritis pain in her right thumb, worse since she fell.  CBD oil helps. Denies significant swelling/redness. Hurts more at the end of the day after a lot of writing.  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  She saw a dermatologist and had the area on her chest removed.  Doesn't recall the pathology. She doesn't plan  to go back to that office (felt rushed).  OA bilateral knees:She saw [facility] in the pastwho sent her to PT, and is doing well with home exercise program. She also had PT for scoliosis and back pain, does her exercises and is doing well overall. Doing well with no problems, as long as she does her exercises.  Some recurrence when she stopped home exercises for a few weeks in the past.  Has been very good about doing her exercises.  Review of chart shows that her TSH tends to be  running on the low side, being monitored.  She denies changes to hair/skin/bowels/energy/weight, palpitations. She finds she doesn't tolerate the heat well, no other symptoms. Lab Results  Component Value Date   TSH 0.495 10/12/2018    Immunization History  Administered Date(s) Administered  . DTaP 03/04/2004  . Fluad Quad(high Dose 65+) 11/14/2018  . Influenza Split 12/27/2010, 12/03/2011, 01/02/2013  . Influenza, High Dose Seasonal PF 12/03/2016, 01/01/2018, 12/03/2019  . Influenza,inj,quad, With Preservative 10/10/2014  . PFIZER SARS-COV-2 Vaccination 03/27/2019, 04/18/2019, 10/24/2019  . Pneumococcal Conjugate-13 06/10/2013  . Pneumococcal Polysaccharide-23 08/24/2014  . Tdap 06/10/2013  . Zoster 08/17/2013  . Zoster Recombinat (Shingrix) 09/10/2017, 01/06/2018   Last Pap smear: 05/2015 at GYN, benign. Last saw GYN last month, told paps not needed. Last mammogram: 04/2019 Last colonoscopy: 06/2019 Dr.Kothari-51m polyp in desc colon;sessile, internal hemorrhoids. Pathology: ONE FRAGMENT OF COLONIC MUCOSA WITH A LYMPHOID AGGREGATE. NO ADENOMATOUS MUCOSA IS SEEN. Prior colonoscopy was1/2018--Dr. WEliberto Ivory([facility]): polyps and internal hemorrhoids.  Last DEXA:10/2015 normal Dentist: twice yearly  Ophtho: yearly Exercise:Hasn't been swimming since COVID, not walking as much. Routine prior to illness was: Swims 3 days/week (30+ mins in lap pool), walks on the days she doesn't swim (but not when it is hot). Pilates and PT HEP at least 3 days/week if not daily.  Used her kettlebell weights 3x/week. Hepatitis C screen done through GYN in March 2017, negative Vitamin D-OH level normal at 47(checked by GYN 05/2016) Lipid screen: Lab Results  Component Value Date   CHOL 214 (H) 08/29/2016   HDL 106 08/29/2016   LDLCALC 91 08/29/2016   TRIG 85 08/29/2016   CHOLHDL 2.0 08/29/2016    Other doctors caring for patient include: [place] and [place] [facility] K[facility]mon Pali([facility]) D[facility]: [facility] Depression screen: Negative Fall screen:1, increase in pain at base of thumb, no injury/treatment Functional Status screen: notable for some hearing decline (no change from last year). Has always had issues with long-term memory, has had to "work hard". Not good with names. Worse now due to "brain fog" from recent COVID, worse at the end of the day. Mini-Cog screen: normal (5)  End of Life Discussion: Patient hasa living will and medical power of attorney. This has been received (scan date 02/03/2019)  PMH, PSH, SH and FH were reviewed and updated  Outpatient Encounter Medications as of 12/29/2019  Medication Sig Note  . calcium carbonate (TUMS - DOSED IN MG ELEMENTAL CALCIUM) 500 MG chewable tablet Chew 2 tablets by mouth daily. 12/29/2019: Uses at night if eating late, and/or as extra calcium if needed  . calcium-vitamin D (OSCAL WITH D) 500-200 MG-UNIT tablet Take 1 tablet by mouth daily with breakfast.    . fluticasone (FLONASE) 50 MCG/ACT nasal spray USE 2 SPRAYS IN EACH NOSTRIL EVERY DAY   . Multiple Vitamin (  MULTIVITAMIN) tablet Take 1 tablet by mouth daily.   . NON FORMULARY Take 3 capsules by mouth daily. 08/29/2016: Cosamine ASU  . Omega-3 Fatty Acids (FISH OIL) 1000 MG CAPS Take 2 capsules by mouth daily.    . sertraline (ZOLOFT) 50 MG tablet TAKE 1 AND 1/2 TABLETS BY MOUTH DAILY   . traZODone (DESYREL) 50 MG tablet TAKE 1/2 TABLET BY MOUTH AT BEDTIME. YOU MAY TAKE A FULL TABLET AT BEDTIME IF NEEDED. 12/29/2019: Taking full tablet nightly  . triamcinolone lotion (KENALOG) 0.1 % APPLY TO AFFECTED AREA ON SCALP TWICE A DAY FOR 2-4 WEEKS AS NEEDED FOR FLARES   . [DISCONTINUED] clobetasol (TEMOVATE) 0.05 % external solution Apply to affected areas on scalp 2x daily x 2-4 weeks prn flares   . [DISCONTINUED] Omega 3-6-9 Fatty Acids  (OMEGA 3-6-9 COMPLEX PO) Take 2 capsules by mouth daily.    . pseudoephedrine (SUDAFED) 30 MG tablet Take 30 mg by mouth every 4 (four) hours as needed for congestion. (Patient not taking: Reported on 12/29/2019) 06/10/2013: Uses prn sinus pain  . [DISCONTINUED] lansoprazole (PREVACID) 15 MG capsule Take 30 mg by mouth daily.   10/12/2018: Uses very rarely prn only  . [DISCONTINUED] loratadine (CLARITIN) 10 MG tablet Take 10 mg by mouth daily. (Patient not taking: Reported on 12/29/2019) 08/24/2014: Uses prn   No facility-administered encounter medications on file as of 12/29/2019.   Allergies  Allergen Reactions  . Codeine Other (See Comments)    Feels faint.    ROS: The patient denies anorexia, fever, weight changes, headaches, vision changes, ear pain, sore throat, breast concerns, palpitations, dizziness, syncope, dyspnea on exertion, cough, swelling, nausea, vomiting, diarrhea, abdominal pain, melena, hematochezia, hematuria, incontinence, dysuria, vaginal bleeding, discharge, odor or itch, genital lesions, joint pains, numbness, tingling, weakness, tremor, suspicious skin lesions, depression, anxiety, abnormal bleeding/bruising, or enlarged lymph nodes. Allergies are controlled with her current regimen. Moods and insomnia per HPI. Back and knee pain resolved (as long as she does home exercises). Gets "twinges" of discomfort if over-caffeinates, too much wine, or a lot of stress-- No exertional chest pain. Rare mild dizziness (LH) Mild reflux if eats late at night. Denies dysphagia   PHYSICAL EXAM:  BP 100/60   Pulse 60   Ht 5' 8"  (1.727 m)   Wt 140 lb 3.2 oz (63.6 kg)   LMP 02/02/2003   BMI 21.32 kg/m   Wt Readings from Last 3 Encounters:  12/29/19 140 lb 3.2 oz (63.6 kg)  10/12/18 142 lb 3.2 oz (64.5 kg)  09/30/17 142 lb 9.6 oz (64.7 kg)    General Appearance:   Alert, cooperative, appears younger than her stated age. She was not in gown for visit today. No cough during  visit.  She appears comfortable and in no distress.  Head:   Normocephalic, without obvious abnormality, atraumatic  Eyes:   PERRL, conjunctiva/corneas clear, EOM's intact, fundi benign  Ears:   Normal TM's and external ear canals  Nose:  Not examined (wearing mask due to COVID-19 pandemic)  Throat:  Not examined (wearing mask due to COVID-19 pandemic)  Neck:  Supple, no lymphadenopathy; thyroid: noenlargement/ tenderness/nodules; no carotid bruit or JVD.   Back:  Spine nontender, no CVAtenderness. Scoliosis noted, with left hip slightly higher. Nontender, no muscle spasm.   Lungs:   Clear to auscultation bilaterally without wheezes, rales orronchi; respirations unlabored  Chest Wall:   No tenderness or deformity  Heart:   Regular rate and rhythm, S1 and S2 normal, no  murmur, rub or gallop. No click appreciable  Breast Exam:   Deferred to GYN  Abdomen:   Soft, non-tender, nondistended, normoactive bowel sounds, no masses, no hepatosplenomegaly  Genitalia:   Deferred to GYN     Extremities:  No clubbing, cyanosis or edema. Some hypertrophy noted at [facility] on right, slightly tender. No erythema, warmth, or swelling.  Pulses:  2+ and symmetric all extremities  Skin:  Skin color, texture, turgor normal, no rashes or lesions. Skin exam limited, didn't change into gown. Chest examined--no longer has any suspicious lesion  Lymph nodes:  Cervical, supraclavicular nodes normal  Neurologic:  Normal strength, sensation and gait; reflexes 2+ and symmetric throughout  Psych: Normal mood, affect, hygiene and grooming  PHQ-2 score 0 PHQ-9 score of 7--mainly sleep issues, worse since COVID.   ASSESSMENT/PLAN:  Encounter for Medicare annual wellness exam  Allergic rhinitis, unspecified seasonality, unspecified trigger - controlled with flonase  Seborrheic dermatitis of scalp - controlled with prn use of TAC  Depression,  major, in remission ([place]) - well controlled with 70m sertraline  Insomnia, unspecified type - some worsening sleep since COVID. Cont Trazadone qHS, proper sleep hygiene.  Needing some naps due to fatigue from COVID  Medication monitoring encounter - Plan: Comprehensive metabolic panel, CBC with Differential/Platelet  Abnormal TSH - Plan: TSH  Fatigue, unspecified type - post-COVID.  Has been slowly improving. Reminded to listen to her body, take things slowly back to her usual regimen - Plan: Comprehensive metabolic panel, CBC with Differential/Platelet, TSH  History of COVID-19 - illness in early September. Persistent symptoms are slowly improving. Intermittent brain fog, fatigue, slight cough  Screening for lipid disorders - Plan: Lipid panel  Gastroesophageal reflux disease without esophagitis - no longer on PPI.  Gets chest "twinges" related to caffeine/ETOH/stress--consider esophageal rather than cardiac. Discussed Pepcid vs Tums  Mitral valve prolapse - pt reassured there is no evidence of murmur to suggest MR or need for echo. Try pepcid prior to late meals. If worsening sx, can send to cardio   c-met, CBC, TSH, lipid  Discussed monthly self breast exams and yearly mammograms; at least 30 minutes of aerobic activity at least 5 days/week, weight bearing exercise at least 2x/wk; proper sunscreen use reviewed; healthy diet, including goals of calcium and vitamin D intake and alcohol recommendations (less than or equal to 1 drink/day) reviewed; regular seatbelt use; changing batteries in smoke detectors. Immunization recommendations discussed--up to date. Colonoscopy recommendations reviewed, UTD.  MOST form reviewed/updated. Full code, full care.   F/u 1 year, sooner prn. Patient may get a PCP closer to [place] [facility] She said last year also, hasn't switched. She will let u[place]ore[place]now if/when she switches, we can send records and cxl appt. Will  schedule for 1 year now.    Medicare Attestation I have personally reviewed: The patient's medical and social history Their use of alcohol, tobacco or illicit drugs Their current medications and supplements The patient's functional ability including ADLs,fall risks, home safety risks, cognitive, and hearing and visual impairment Diet and physical activities Evidence for depression or mood disorders  The patient's weight, height, BMI have been recorded in the chart.  I have made referrals, counseling, and provided education to the patient based on review of the above and I have provided the patient with a written personalized care plan for preventive services.

## 2019-12-29 ENCOUNTER — Other Ambulatory Visit: Payer: Self-pay

## 2019-12-29 ENCOUNTER — Encounter: Payer: Self-pay | Admitting: Family Medicine

## 2019-12-29 ENCOUNTER — Ambulatory Visit (INDEPENDENT_AMBULATORY_CARE_PROVIDER_SITE_OTHER): Payer: Medicare Other | Admitting: Family Medicine

## 2019-12-29 VITALS — BP 100/60 | HR 60 | Ht 68.0 in | Wt 140.2 lb

## 2019-12-29 DIAGNOSIS — I341 Nonrheumatic mitral (valve) prolapse: Secondary | ICD-10-CM

## 2019-12-29 DIAGNOSIS — Z13220 Encounter for screening for lipoid disorders: Secondary | ICD-10-CM | POA: Diagnosis not present

## 2019-12-29 DIAGNOSIS — G4700 Insomnia, unspecified: Secondary | ICD-10-CM

## 2019-12-29 DIAGNOSIS — K219 Gastro-esophageal reflux disease without esophagitis: Secondary | ICD-10-CM | POA: Diagnosis not present

## 2019-12-29 DIAGNOSIS — R7989 Other specified abnormal findings of blood chemistry: Secondary | ICD-10-CM

## 2019-12-29 DIAGNOSIS — R5383 Other fatigue: Secondary | ICD-10-CM

## 2019-12-29 DIAGNOSIS — J309 Allergic rhinitis, unspecified: Secondary | ICD-10-CM | POA: Diagnosis not present

## 2019-12-29 DIAGNOSIS — Z8616 Personal history of COVID-19: Secondary | ICD-10-CM | POA: Diagnosis not present

## 2019-12-29 DIAGNOSIS — Z0000 Encounter for general adult medical examination without abnormal findings: Secondary | ICD-10-CM

## 2019-12-29 DIAGNOSIS — F325 Major depressive disorder, single episode, in full remission: Secondary | ICD-10-CM | POA: Diagnosis not present

## 2019-12-29 DIAGNOSIS — Z5181 Encounter for therapeutic drug level monitoring: Secondary | ICD-10-CM | POA: Diagnosis not present

## 2019-12-29 DIAGNOSIS — L219 Seborrheic dermatitis, unspecified: Secondary | ICD-10-CM

## 2019-12-30 LAB — CBC WITH DIFFERENTIAL/PLATELET
Basophils Absolute: 0 10*3/uL (ref 0.0–0.2)
Basos: 1 %
EOS (ABSOLUTE): 0.1 10*3/uL (ref 0.0–0.4)
Eos: 3 %
Hematocrit: 42.7 % (ref 34.0–46.6)
Hemoglobin: 14.7 g/dL (ref 11.1–15.9)
Immature Grans (Abs): 0 10*3/uL (ref 0.0–0.1)
Immature Granulocytes: 0 %
Lymphocytes Absolute: 1.3 10*3/uL (ref 0.7–3.1)
Lymphs: 29 %
MCH: 33.1 pg — ABNORMAL HIGH (ref 26.6–33.0)
MCHC: 34.4 g/dL (ref 31.5–35.7)
MCV: 96 fL (ref 79–97)
Monocytes Absolute: 0.3 10*3/uL (ref 0.1–0.9)
Monocytes: 8 %
Neutrophils Absolute: 2.6 10*3/uL (ref 1.4–7.0)
Neutrophils: 59 %
Platelets: 222 10*3/uL (ref 150–450)
RBC: 4.44 x10E6/uL (ref 3.77–5.28)
RDW: 11.8 % (ref 11.7–15.4)
WBC: 4.4 10*3/uL (ref 3.4–10.8)

## 2019-12-30 LAB — LIPID PANEL
Chol/HDL Ratio: 2.3 ratio (ref 0.0–4.4)
Cholesterol, Total: 209 mg/dL — ABNORMAL HIGH (ref 100–199)
HDL: 89 mg/dL (ref >39)
LDL Chol Calc (NIH): 104 mg/dL — ABNORMAL HIGH (ref 0–99)
Triglycerides: 95 mg/dL (ref 0–149)
VLDL Cholesterol Cal: 16 mg/dL (ref 5–40)

## 2019-12-30 LAB — COMPREHENSIVE METABOLIC PANEL
ALT: 16 IU/L (ref 0–32)
AST: 26 IU/L (ref 0–40)
Albumin/Globulin Ratio: 2.6 — ABNORMAL HIGH (ref 1.2–2.2)
Albumin: 4.9 g/dL — ABNORMAL HIGH (ref 3.7–4.7)
Alkaline Phosphatase: 65 IU/L (ref 44–121)
BUN/Creatinine Ratio: 12 (ref 12–28)
BUN: 11 mg/dL (ref 8–27)
Bilirubin Total: 0.6 mg/dL (ref 0.0–1.2)
CO2: 28 mmol/L (ref 20–29)
Calcium: 9.9 mg/dL (ref 8.7–10.3)
Chloride: 103 mmol/L (ref 96–106)
Creatinine, Ser: 0.89 mg/dL (ref 0.57–1.00)
GFR calc Af Amer: 75 mL/min/{1.73_m2} (ref >59)
GFR calc non Af Amer: 65 mL/min/{1.73_m2} (ref >59)
Globulin, Total: 1.9 g/dL (ref 1.5–4.5)
Glucose: 93 mg/dL (ref 65–99)
Potassium: 4.4 mmol/L (ref 3.5–5.2)
Sodium: 142 mmol/L (ref 134–144)
Total Protein: 6.8 g/dL (ref 6.0–8.5)

## 2019-12-30 LAB — TSH: TSH: 0.484 u[IU]/mL (ref 0.450–4.500)

## 2020-01-24 ENCOUNTER — Other Ambulatory Visit: Payer: Self-pay | Admitting: Family Medicine

## 2020-01-24 DIAGNOSIS — Z20822 Contact with and (suspected) exposure to covid-19: Secondary | ICD-10-CM | POA: Diagnosis not present

## 2020-01-24 DIAGNOSIS — F325 Major depressive disorder, single episode, in full remission: Secondary | ICD-10-CM

## 2020-01-24 NOTE — Telephone Encounter (Signed)
Pt. Requesting refill on sertraline last filled 10/26/19 pt. Last apt was 12/29/19 and next apt is 01/08/21.

## 2020-02-23 DIAGNOSIS — Z20822 Contact with and (suspected) exposure to covid-19: Secondary | ICD-10-CM | POA: Diagnosis not present

## 2020-03-25 ENCOUNTER — Other Ambulatory Visit: Payer: Self-pay | Admitting: Family Medicine

## 2020-03-25 DIAGNOSIS — G4700 Insomnia, unspecified: Secondary | ICD-10-CM

## 2020-03-27 NOTE — Telephone Encounter (Signed)
Is this okay to refill? 

## 2020-09-01 ENCOUNTER — Other Ambulatory Visit: Payer: Self-pay | Admitting: Family Medicine

## 2020-09-01 DIAGNOSIS — J309 Allergic rhinitis, unspecified: Secondary | ICD-10-CM

## 2020-09-01 DIAGNOSIS — L219 Seborrheic dermatitis, unspecified: Secondary | ICD-10-CM

## 2020-09-01 NOTE — Telephone Encounter (Signed)
Please advise id this cream is ok to fill. Triamcinolone. [place]

## 2021-01-04 ENCOUNTER — Other Ambulatory Visit: Payer: Self-pay | Admitting: Family Medicine

## 2021-01-04 DIAGNOSIS — F325 Major depressive disorder, single episode, in full remission: Secondary | ICD-10-CM

## 2021-01-07 NOTE — Progress Notes (Deleted)
No chief complaint on file.   Stephanie Harrell is a 75 y.o. female who presents for Medicare annual wellness visit and follow-up on chronic medical conditions.   She has the following concerns:  Depression:  Moods are well controlled on sertraline. Titrated down from 141m to 774m3 years ago, and continues to do well on this dose. She previously tried cutting back further to 5015mnoticed more anxiety and more fatigue.  Insomnia:  She previously had trouble falling asleep, staying asleep. She take a full trazodone tablet nightly, which does helps her fall asleep, not necessarily stay asleep. Insomnia has been worse since she had COVID   Allergies:  She has perennial allergies--mostly related to leaves, worse seasonally in fall and spring. She is using Flonase daily, along with Neti-pot.  Uses sudafed prn infrequent (once a month), not taking antihistamines.  She notes slight decrease in hearing in both ears, L>R, gradual with age. She noticed this gradually over the years, but unchanged from last year. Denies tinnitus.  H/o MVP.  Sometimes she gets a sharp, knife-like pain in her chest, which she relates to her mitral valve. It is short-lived, resolves quickly and completely.  It used to happen just 2x/year, for many years.  Last year she reported that it was happening more frequently, every 2 months, but not as severe, just a "little knife". She notices it more if she "over-caffeinates, too much wine, or a lot of stress".  She denies exertional chest pain. We had discussed that her symptoms could be esophageal, rather than cardiac, and was reassured that no murmur or abnormal cardiac exam noted. We had discussed antacids.  She has a h/o GERD, with symptoms mainly being controlled by diet, and prn use of Tums (previously tok Prevacid). She denies any DOE, edema. She denies any dysphagia.  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  CBD oil helps. Denies significant swelling/redness. Hurts more at the  end of the day after a lot of writing.   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   OA bilateral knees:  She saw [facility] in the past who sent her to PT, and is doing well with home exercise program. She also had PT for scoliosis and back pain, and does well as long as she does her exercises.   Review of chart shows that her TSH tends to be running on the low side, being monitored.  She denies changes to hair/skin/bowels/energy/weight, palpitations. She finds she doesn't tolerate the heat well, no other symptoms. Lab Results  Component Value Date   TSH 0.484 12/29/2019    Immunization History  Administered Date(s) Administered   DTaP 03/04/2004   Fluad Quad(high Dose 65+) 11/14/2018, 11/28/2020   Hepatitis B 05/03/2015   Influenza Split 12/27/2010, 12/03/2011, 01/02/2013   Influenza, High Dose Seasonal PF 12/03/2016, 01/01/2018, 12/03/2019   Influenza,inj,quad, With Preservative 10/10/2014   Moderna Sars-Covid-2 Vaccination 10/24/2019   PFIZER Comirnaty(Gray Top)Covid-19 Tri-Sucrose Vaccine 07/10/2020   PFIZER(Purple Top)SARS-COV-2 Vaccination 03/27/2019, 04/18/2019, 10/24/2019   Pfizer Covid-19 Vaccine Bivalent Booster 12y35yrup 11/28/2020   Pneumococcal Conjugate-13 06/10/2013   Pneumococcal Polysaccharide-23 08/24/2014   Tdap 06/10/2013   Zoster Recombinat (Shingrix) 09/10/2017, 01/06/2018   Zoster, Live 08/17/2013   Last Pap smear: 05/2015 at GYN, benign. Last saw GYN last year, no further paps needed. Last mammogram: 04/2019 Last colonoscopy:  06/2019 Dr.Kothari--2mm 74myp and internal hemorrhoids. Pathology: fragment of colonic mucosa with a  lymphoid aggregate, no adenomatous mucosa seen. Prior colonoscopy was 03/2016--Dr. Eliberto Ivory ([facility]): polyps and internal hemorrhoids.  Last DEXA: 10/2015 normal Dentist: twice yearly  Ophtho: yearly Exercise:  Hasn't been swimming since COVID, not walking as much. Routine  prior to illness was: Swims 3 days/week (30+ mins in lap pool), walks on the days she doesn't swim (but not when it is hot). Pilates and PT HEP at least 3 days/week if not daily.  Used her kettlebell weights 3x/week.  Hepatitis C screen done through GYN in March 2017, negative Vitamin D-OH level normal at 47 (checked by GYN 05/2016) Lipid screen: Lab Results  Component Value Date   CHOL 209 (H) 12/29/2019   HDL 89 12/29/2019   LDLCALC 104 (H) 12/29/2019   TRIG 95 12/29/2019   CHOLHDL 2.3 12/29/2019     Patient Care Team: Rita Ohara, MD as PCP - General (Family Medicine) GYN: Dr. Nickola Major at [facility] (for knee pain and scoliosis) Dentist: [facility] in [place] GI: Dr. Wilmon Pali ([facility]) Derm: [facility]  Depression Screening: [place] Office Visit from 12/29/2019 in [place]  PHQ-2 Total Score [place] Office Visit from 12/29/2019 in [place]  PHQ-9 Total Score 7       Falls screen:  Fall Risk  12/29/2019 10/12/2018 09/11/2017 08/29/2016 08/24/2015  Falls in the past year? 1 0 No No Yes  Comment - - - - Fell [place] climbing- knee gave out and saw [facility], dx with arthrits.   Number falls in past yr: 0 - - - 1  Injury with Fall? 1 - - - No  Comment bruising to right hand, making arthrithis to be more painful - - - -  Follow up - Falls evaluation completed - - Falls prevention discussed     Functional Status Survey:           End of Life Discussion:  Patient has a living will and medical power of attorney. This has been received (scan date 02/03/2019)    PMH, PSH, SH and FH were reviewed and updated    ROS:  The patient denies anorexia, fever, weight changes, headaches,  vision changes, ear pain, sore throat, breast concerns, palpitations, dizziness, syncope, dyspnea on exertion, cough, swelling, nausea, vomiting, diarrhea, abdominal pain,  melena, hematochezia, hematuria, incontinence, dysuria, vaginal bleeding, discharge, odor or itch, genital lesions, joint pains, numbness, tingling, weakness, tremor, suspicious skin lesions, depression, anxiety, abnormal bleeding/bruising, or enlarged lymph nodes. Allergies are controlled with her current regimen. Moods and insomnia per HPI. Back and knee pain resolved (as long as she does home exercises). Gets "twinges" of chest discomfort if over-caffeinates, too much wine, or a lot of stress; denies exertional chest pain. Rare mild dizziness (LH) Mild reflux if eats late at night. Denies dysphagia   PHYSICAL EXAM:  LMP 02/02/2003   Wt Readings from Last 3 Encounters:  12/29/19 140 lb 3.2 oz (63.6 kg)  10/12/18 142 lb 3.2 oz (64.5 kg)  09/30/17 142 lb 9.6 oz (64.7 kg)    General Appearance:     Alert, cooperative, appears younger than her stated age. She was not in gown for visit today. No cough during visit.  She appears comfortable and in no distress.  Head:     Normocephalic, without obvious abnormality, atraumatic   Eyes:     PERRL, conjunctiva/corneas clear, EOM's intact, fundi benign   Ears:  Normal TM's and external ear canals   Nose:    Not examined (wearing mask due to COVID-19 pandemic)  Throat:    Not examined (wearing mask due to COVID-19 pandemic)  Neck:    Supple, no lymphadenopathy;  thyroid:  no enlargement/ tenderness/nodules; no carotid bruit or JVD.   Back:     Spine nontender, no CVA tenderness. Scoliosis noted, with left hip slightly higher. Nontender, no muscle spasm.   Lungs:      Clear to auscultation bilaterally without wheezes, rales or ronchi; respirations unlabored   Chest Wall:     No tenderness or deformity    Heart:     Regular rate and rhythm, S1 and S2 normal, no murmur, rub or gallop.  No click appreciable  Breast Exam:     Deferred to GYN   Abdomen:      Soft, non-tender, nondistended, normoactive bowel sounds, no masses, no hepatosplenomegaly    Genitalia:     Deferred to GYN        Extremities:    No clubbing, cyanosis or edema. Some hypertrophy noted at [facility] on right, slightly tender. No erythema, warmth, or swelling.  Pulses:    2+ and symmetric all extremities   Skin:    Skin color, texture, turgor normal, no rashes or lesions. Skin exam limited, didn't change into gown.  Lymph nodes:    Cervical, supraclavicular nodes normal   Neurologic:    Normal strength, sensation and gait; reflexes 2+ and symmetric throughout                     Psych:   Normal mood, affect, hygiene and grooming  Update back, R thumb  ASSESSMENT/PLAN:  PHQ-9  NEED TO UPDATE IMMUNIZATIONS--HER 10/24/19 COVID vaccine is listed under [place] and [employer]. No vaccines needed today, UTD  Has she had mammo since 04/2019?  (GYN?)  If so, please get results.   Consider TSH Consider CBC, c-met (normal last year)  Discussed monthly self breast exams and yearly mammograms; at least 30 minutes of aerobic activity at least 5 days/week, weight bearing exercise at least 2x/wk; proper sunscreen use reviewed; healthy diet, including goals of calcium and vitamin D intake and alcohol recommendations (less than or equal to 1 drink/day) reviewed; regular seatbelt use; changing batteries in smoke detectors.  Immunization recommendations discussed--up to date.  Colonoscopy recommendations reviewed, UTD.  MOST form reviewed/updated. Full code, full care.    F/u 1 year, sooner prn. Patient may get a PCP closer to home (has been traveling from [employer] for visits). She said last year also, hasn't switched. She will let us know if/when she switches, we can send records and cxl appt. Will schedule for 1 year now.    Medicare Attestation I have personally reviewed: The patient's medical and social history Their use of alcohol, tobacco or illicit drugs Their current medications and supplements The patient's functional ability including ADLs,fall risks, home  safety risks, cognitive, and hearing and visual impairment Diet and physical activities Evidence for depression or mood disorders  The patient's weight, height, BMI have been recorded in the chart.  I have made referrals, counseling, and provided education to the patient based on review of the above and I have provided the patient with a written personalized care plan for preventive services.

## 2021-01-07 NOTE — Patient Instructions (Incomplete)
°  HEALTH MAINTENANCE RECOMMENDATIONS:  It is recommended that you get at least 30 minutes of aerobic exercise at least 5 days/week (for weight loss, you may need as much as 60-90 minutes). This can be any activity that gets your heart rate up. This can be divided in 10-15 minute intervals if needed, but try and build up your endurance at least once a week.  Weight bearing exercise is also recommended twice weekly.  Eat a healthy diet with lots of vegetables, fruits and fiber.  "Colorful" foods have a lot of vitamins (ie green vegetables, tomatoes, red peppers, etc).  Limit sweet tea, regular sodas and alcoholic beverages, all of which has a lot of calories and sugar.  Up to 1 alcoholic drink daily may be beneficial for women (unless trying to lose weight, watch sugars).  Drink a lot of water.  Calcium recommendations are 1200-1500 mg daily (1500 mg for postmenopausal women or women without ovaries), and vitamin D 1000 IU daily.  This should be obtained from diet and/or supplements (vitamins), and calcium should not be taken all at once, but in divided doses.  Monthly self breast exams and yearly mammograms for women over the age of 73 is recommended.  Sunscreen of at least SPF 30 should be used on all sun-exposed parts of the skin when outside between the hours of 10 am and 4 pm (not just when at beach or pool, but even with exercise, golf, tennis, and yard work!)  Use a sunscreen that says "broad spectrum" so it covers both UVA and UVB rays, and make sure to reapply every 1-2 hours.  Remember to change the batteries in your smoke detectors when changing your clock times in the spring and fall. Carbon monoxide detectors are recommended for your home.  Use your seat belt every time you are in a car, and please drive safely and not be distracted with cell phones and texting while driving.   Stephanie Harrell , Thank you for taking time to come for your Medicare Wellness Visit. I appreciate your ongoing  commitment to your health goals. Please review the following plan we discussed and let me know if I can assist you in the future.   This is a list of the screening recommended for you and due dates:  Health Maintenance  Topic Date Due   Mammogram  04/08/2020   Tetanus Vaccine  06/11/2023   Colon Cancer Screening  06/03/2024   Pneumonia Vaccine  Completed   Flu Shot  Completed   DEXA scan (bone density measurement)  Completed   COVID-19 Vaccine  Completed   Hepatitis C Screening: USPSTF Recommendation to screen - Ages 34-79 yo.  Completed   Zoster (Shingles) Vaccine  Completed   HPV Vaccine  Aged Out   We do not have any report of mammogram since 04/2019 (hence the due date above).  Please continue yearly mammograms

## 2021-01-08 ENCOUNTER — Encounter: Payer: Self-pay | Admitting: Family Medicine

## 2021-01-08 ENCOUNTER — Telehealth: Payer: Self-pay | Admitting: *Deleted

## 2021-01-08 ENCOUNTER — Ambulatory Visit: Payer: Medicare Other | Admitting: Family Medicine

## 2021-01-08 DIAGNOSIS — Z0000 Encounter for general adult medical examination without abnormal findings: Secondary | ICD-10-CM

## 2021-01-08 DIAGNOSIS — F325 Major depressive disorder, single episode, in full remission: Secondary | ICD-10-CM

## 2021-01-08 DIAGNOSIS — G4700 Insomnia, unspecified: Secondary | ICD-10-CM

## 2021-01-08 DIAGNOSIS — Z5181 Encounter for therapeutic drug level monitoring: Secondary | ICD-10-CM

## 2021-01-08 DIAGNOSIS — I341 Nonrheumatic mitral (valve) prolapse: Secondary | ICD-10-CM

## 2021-01-08 DIAGNOSIS — R7989 Other specified abnormal findings of blood chemistry: Secondary | ICD-10-CM

## 2021-01-08 DIAGNOSIS — J309 Allergic rhinitis, unspecified: Secondary | ICD-10-CM

## 2021-01-08 DIAGNOSIS — L219 Seborrheic dermatitis, unspecified: Secondary | ICD-10-CM

## 2021-01-08 MED ORDER — SERTRALINE HCL 50 MG PO TABS: 75.0000 mg | ORAL_TABLET | Freq: Every day | ORAL | 0 refills | Status: DC

## 2021-01-08 NOTE — Telephone Encounter (Signed)

## 2021-01-08 NOTE — Telephone Encounter (Signed)
I'm guessing that she finally found a PCP in [place].  But she did no show this visit.  Should be sent letter and charge. Doesn't need to r/s if she found care closer to where she lives

## 2021-03-14 ENCOUNTER — Other Ambulatory Visit: Payer: Self-pay | Admitting: Family Medicine

## 2021-03-14 DIAGNOSIS — G4700 Insomnia, unspecified: Secondary | ICD-10-CM

## 2021-04-06 ENCOUNTER — Other Ambulatory Visit: Payer: Self-pay | Admitting: Family Medicine

## 2021-04-06 ENCOUNTER — Encounter: Payer: Self-pay | Admitting: Family Medicine

## 2021-04-06 DIAGNOSIS — G4700 Insomnia, unspecified: Secondary | ICD-10-CM

## 2021-04-06 DIAGNOSIS — F325 Major depressive disorder, single episode, in full remission: Secondary | ICD-10-CM

## 2021-04-06 MED ORDER — TRAZODONE HCL 50 MG PO TABS: 25.0000 mg | ORAL_TABLET | Freq: Every day | ORAL | 0 refills | Status: AC

## 2021-04-06 MED ORDER — SERTRALINE HCL 50 MG PO TABS: 75.0000 mg | ORAL_TABLET | Freq: Every day | ORAL | 0 refills | Status: AC

## 2021-04-06 NOTE — Telephone Encounter (Signed)
Left voicemail to call back to see if she moved per last mychart message

## 2021-04-06 NOTE — Telephone Encounter (Signed)
Noted,  Thank you!

## 2021-07-06 ENCOUNTER — Other Ambulatory Visit: Payer: Self-pay | Admitting: Family Medicine

## 2021-07-06 DIAGNOSIS — F325 Major depressive disorder, single episode, in full remission: Secondary | ICD-10-CM

## 2021-07-06 NOTE — Telephone Encounter (Signed)
[facility] is requesting to fill pt zoloft. Please advise [facility] 

## 2021-07-07 ENCOUNTER — Other Ambulatory Visit: Payer: Self-pay | Admitting: Family Medicine

## 2021-07-07 DIAGNOSIS — G4700 Insomnia, unspecified: Secondary | ICD-10-CM
